# Patient Record
Sex: Female | Born: 1940 | Race: White | Hispanic: No | Smoking: Never smoker
Health system: Southern US, Community
[De-identification: ages and names within clinical notes are randomized; demographics above are authoritative.]

## PROBLEM LIST (undated history)

## (undated) DIAGNOSIS — K219 Gastro-esophageal reflux disease without esophagitis: Secondary | ICD-10-CM

## (undated) DIAGNOSIS — E785 Hyperlipidemia, unspecified: Secondary | ICD-10-CM

## (undated) DIAGNOSIS — G4730 Sleep apnea, unspecified: Secondary | ICD-10-CM

## (undated) DIAGNOSIS — Z820 Family history of epilepsy and other diseases of the nervous system: Secondary | ICD-10-CM

## (undated) DIAGNOSIS — R001 Bradycardia, unspecified: Secondary | ICD-10-CM

## (undated) DIAGNOSIS — I4891 Unspecified atrial fibrillation: Secondary | ICD-10-CM

## (undated) DIAGNOSIS — G459 Transient cerebral ischemic attack, unspecified: Secondary | ICD-10-CM

## (undated) DIAGNOSIS — M1990 Unspecified osteoarthritis, unspecified site: Secondary | ICD-10-CM

## (undated) DIAGNOSIS — I2510 Atherosclerotic heart disease of native coronary artery without angina pectoris: Secondary | ICD-10-CM

## (undated) DIAGNOSIS — K8020 Calculus of gallbladder without cholecystitis without obstruction: Secondary | ICD-10-CM

## (undated) DIAGNOSIS — M797 Fibromyalgia: Secondary | ICD-10-CM

## (undated) DIAGNOSIS — I10 Essential (primary) hypertension: Secondary | ICD-10-CM

## (undated) HISTORY — DX: Fibromyalgia: M79.7

## (undated) HISTORY — PX: HERNIA REPAIR: SHX51

## (undated) HISTORY — DX: Bradycardia, unspecified: R00.1

## (undated) HISTORY — DX: Calculus of gallbladder without cholecystitis without obstruction: K80.20

## (undated) HISTORY — DX: Gastro-esophageal reflux disease without esophagitis: K21.9

## (undated) HISTORY — DX: Essential (primary) hypertension: I10

## (undated) HISTORY — DX: Sleep apnea, unspecified: G47.30

## (undated) HISTORY — DX: Unspecified osteoarthritis, unspecified site: M19.90

## (undated) HISTORY — PX: ABDOMINAL SURGERY: SHX537

## (undated) HISTORY — DX: Atherosclerotic heart disease of native coronary artery without angina pectoris: I25.10

## (undated) HISTORY — DX: Unspecified atrial fibrillation: I48.91

## (undated) HISTORY — DX: Transient cerebral ischemic attack, unspecified: G45.9

## (undated) HISTORY — DX: Family history of epilepsy and other diseases of the nervous system: Z82.0

## (undated) HISTORY — PX: OTHER SURGICAL HISTORY: SHX169

## (undated) HISTORY — DX: Hyperlipidemia, unspecified: E78.5

---

## 1975-01-16 ENCOUNTER — Encounter: Payer: Self-pay | Admitting: Cardiology

## 2005-07-28 ENCOUNTER — Ambulatory Visit: Payer: Self-pay | Admitting: Cardiology

## 2005-08-09 ENCOUNTER — Ambulatory Visit: Payer: Self-pay

## 2005-08-09 ENCOUNTER — Encounter: Payer: Self-pay | Admitting: Cardiology

## 2006-04-25 ENCOUNTER — Ambulatory Visit: Payer: Self-pay | Admitting: Cardiology

## 2006-06-25 ENCOUNTER — Ambulatory Visit: Payer: Self-pay | Admitting: Cardiology

## 2006-07-02 ENCOUNTER — Ambulatory Visit: Payer: Self-pay | Admitting: Cardiology

## 2006-09-12 ENCOUNTER — Ambulatory Visit: Payer: Self-pay | Admitting: Cardiology

## 2006-09-26 ENCOUNTER — Ambulatory Visit: Payer: Self-pay

## 2007-07-24 ENCOUNTER — Ambulatory Visit: Payer: Self-pay | Admitting: Cardiology

## 2007-08-21 ENCOUNTER — Ambulatory Visit: Payer: Self-pay | Admitting: Cardiology

## 2007-10-09 ENCOUNTER — Ambulatory Visit: Payer: Self-pay | Admitting: Cardiology

## 2008-03-10 ENCOUNTER — Encounter: Admission: RE | Admit: 2008-03-10 | Discharge: 2008-03-10 | Payer: Self-pay | Admitting: General Surgery

## 2008-07-15 ENCOUNTER — Ambulatory Visit: Payer: Self-pay | Admitting: Cardiology

## 2008-07-15 DIAGNOSIS — I4891 Unspecified atrial fibrillation: Secondary | ICD-10-CM

## 2008-07-15 DIAGNOSIS — E7800 Pure hypercholesterolemia, unspecified: Secondary | ICD-10-CM

## 2008-07-15 DIAGNOSIS — I10 Essential (primary) hypertension: Secondary | ICD-10-CM | POA: Insufficient documentation

## 2008-07-15 DIAGNOSIS — K219 Gastro-esophageal reflux disease without esophagitis: Secondary | ICD-10-CM

## 2008-07-15 DIAGNOSIS — I2510 Atherosclerotic heart disease of native coronary artery without angina pectoris: Secondary | ICD-10-CM

## 2008-11-20 ENCOUNTER — Telehealth (INDEPENDENT_AMBULATORY_CARE_PROVIDER_SITE_OTHER): Payer: Self-pay | Admitting: *Deleted

## 2009-03-31 ENCOUNTER — Telehealth (INDEPENDENT_AMBULATORY_CARE_PROVIDER_SITE_OTHER): Payer: Self-pay | Admitting: *Deleted

## 2009-03-31 ENCOUNTER — Encounter: Payer: Self-pay | Admitting: Cardiology

## 2009-03-31 ENCOUNTER — Ambulatory Visit: Payer: Self-pay | Admitting: Cardiology

## 2009-04-06 ENCOUNTER — Encounter: Payer: Self-pay | Admitting: Cardiology

## 2009-04-09 ENCOUNTER — Encounter: Payer: Self-pay | Admitting: Cardiology

## 2009-05-25 ENCOUNTER — Encounter: Payer: Self-pay | Admitting: Cardiology

## 2009-05-27 ENCOUNTER — Encounter: Payer: Self-pay | Admitting: Cardiology

## 2009-05-28 ENCOUNTER — Encounter: Payer: Self-pay | Admitting: Cardiology

## 2009-05-31 ENCOUNTER — Encounter: Payer: Self-pay | Admitting: Cardiology

## 2009-06-02 ENCOUNTER — Ambulatory Visit: Payer: Self-pay | Admitting: Cardiology

## 2009-06-02 ENCOUNTER — Encounter: Payer: Self-pay | Admitting: Cardiology

## 2009-06-02 DIAGNOSIS — I498 Other specified cardiac arrhythmias: Secondary | ICD-10-CM

## 2009-07-21 ENCOUNTER — Encounter: Admission: RE | Admit: 2009-07-21 | Discharge: 2009-07-21 | Payer: Self-pay | Admitting: General Surgery

## 2009-07-28 ENCOUNTER — Encounter: Payer: Self-pay | Admitting: Cardiology

## 2009-07-28 ENCOUNTER — Ambulatory Visit: Payer: Self-pay | Admitting: Cardiology

## 2009-09-24 ENCOUNTER — Telehealth: Payer: Self-pay | Admitting: Cardiology

## 2009-10-14 ENCOUNTER — Encounter: Payer: Self-pay | Admitting: Cardiology

## 2009-11-01 ENCOUNTER — Telehealth: Payer: Self-pay | Admitting: Cardiology

## 2009-11-11 ENCOUNTER — Telehealth (INDEPENDENT_AMBULATORY_CARE_PROVIDER_SITE_OTHER): Payer: Self-pay | Admitting: *Deleted

## 2009-11-15 ENCOUNTER — Encounter: Payer: Self-pay | Admitting: Cardiology

## 2009-11-15 ENCOUNTER — Ambulatory Visit: Payer: Self-pay | Admitting: Cardiology

## 2009-11-15 ENCOUNTER — Encounter (HOSPITAL_COMMUNITY): Admission: RE | Admit: 2009-11-15 | Discharge: 2010-02-01 | Payer: Self-pay | Admitting: Cardiology

## 2009-11-15 ENCOUNTER — Ambulatory Visit: Payer: Self-pay

## 2010-02-23 ENCOUNTER — Encounter: Payer: Self-pay | Admitting: Cardiology

## 2010-02-23 ENCOUNTER — Ambulatory Visit: Payer: Self-pay | Admitting: Cardiology

## 2010-05-05 ENCOUNTER — Telehealth: Payer: Self-pay | Admitting: Cardiology

## 2010-06-28 NOTE — Letter (Signed)
Summary: [facility]  [facility]   Imported By: Roderic Ovens 11/26/2009 10:37:32  _____________________________________________________________________  External Attachment:    Type:   Image     Comment:   External Document

## 2010-06-28 NOTE — Progress Notes (Signed)
Summary: pt is thinking about knee replacement  Phone Note Call from Patient Call back at Home Phone [phone]   Caller: Daughter/Beth Reason for Call: Talk to Nurse, Talk to Doctor Summary of Call: pt is wanting to have knee replacement and daughter wants to talk about this with MD  Initial call taken by: Omer Jack,  September 24, 2009 11:46 AM  Follow-up for Phone Call        Talked with daughter, Waynetta Sandy. Mrs. Campise will be seeing Dr. Rana Snare at [facility] on Monday (09/27/09) to be evaluated for possible knee surgery.  I told her Dr. Jens Som was out of the office and I would forward the information to him.  She said that would be fine. Lisabeth Devoid RN     Appended Document: pt is thinking about knee replacement patient will need lovenox bridge at time of surgery  Appended Document: pt is thinking about knee replacement spoke with beth, they are aware of the need for lovenox. they are sending me the form for dr Jens Som to fill out prior to surgery

## 2010-06-28 NOTE — Progress Notes (Signed)
Summary: appt prior to surgery / forms  Phone Note Call from Patient Call back at Home Phone [phone]   Caller: Daughter Reason for Call: Acute Illness, Talk to Nurse Summary of Call: per pt daughter called. does pt need to see dr. Jens Som prior to surgery, pt daugher is aware that bc is out of office until 6/14 ,. debra his nurse is on vac as well. disuss form.  Initial call taken by: Lorne Skeens,  November 01, 2009 9:20 AM  Follow-up for Phone Call        [facility] Lisabeth Devoid RN I spoke with daughter, Waynetta Sandy,  who needs the surgical clearance form signed and returned to orthopaedic surgeon.  Mrs. Kinnison surgery is scheduled for 12/02/09. Instead of knee surgery she will be having hip surgery,  Her daughter also wanted to know if her mother needed to have the stress test before this surgery? I told her Dr. Jens Som was out of the office this week and I would forward it to him and his nurse as well.  She said the surgeon would like to have the form asap. Lisabeth Devoid RN     Appended Document: appt prior to surgery / forms repeat myoview prior to surgery; given history of possible TIA and perm. atrial fib, she will need lovenox bridge with surgery (ie begin lovenox after dcing coumadin when inr <2 and begin lovenox postop when ok with surgery until inr > 2.  Appended Document: Orders Update lexiscan myoview ordered for surgical clearence, she is aware of needing a lovenox bridge for her coumadin Deliah Goody, RN  November 09, 2009 2:56 PM \   Clinical Lists Changes  Orders: Added new Referral order of Nuclear Stress Test (Nuc Stress Test) - Signed

## 2010-06-28 NOTE — Assessment & Plan Note (Signed)
Summary: [facility]   Visit Type:  Follow-up  CC:  Pt. had TIA.  History of Present Illness: [name] is a very pleasant  female with coronary artery disease, permanent atrial fibrillation, hypertension. Last Myoview was performed in April 2008. There was breast attenuation but no scar or ischemia and ejection fraction was 76%. Last echocardiogram in March 2007 showed normal LV function and a small posterior pericardial effusion. I last saw her in November of this year. Since then she was admitted to a hospital in [place] with a possible TIA. Apparently she had transient left facial droop and dysarthria. She was felt to potentially have a TIA. While she was in the hospital she lost her balance while sitting on a commode and fell and hit her head. There was a significant hematoma over her left eye with ecchymosis. While she was hospitalized she was felt to be somewhat bradycardic with heart rates in the 50s and pauses of 2 seconds per the notes. I do not have strips available. She was seen by a cardiologist and her digoxin was discontinued and her atenolol dose decreased. Since then she does have dyspnea on exertion which is chronic. There is no orthopnea or PND and pedal edema is unchanged. She has not had chest pain or syncope. She is having problems with balance.  Current Medications (verified): 1)  Ranitidine Hcl 150 Mg Caps (Ranitidine Hcl) .... Take 1 Capsule By Mouth Two Times A Day 2)  Tramadol Hcl 50 Mg Tabs (Tramadol Hcl) .... 3-4- X A Day As Needed 3)  Aspirin 81 Mg Tbec (Aspirin) .... Take One Tablet By Mouth Daily 4)  Warfarin Sodium 5 Mg Tabs (Warfarin Sodium) .... Use As Directed By Anticoagulation Clinic 7 1/2 Mg Once Daily 5)  Multivitamins  Tabs (Multiple Vitamin) 6)  Nitrolingual 0.4 Mg/spray Soln (Nitroglycerin) .... One Spray Under Tongue Every 5 Minutes As Needed For Chest Pain---May Repeat Times Three 7)  Vesicare 10 Mg Tabs (Solifenacin Succinate) .... Take 1  Tablet By Mouth Once A Day 8)  Lisinopril 40 Mg Tabs (Lisinopril) .... Take One Tablet By Mouth Daily 9)  Atenolol 25 Mg Tabs (Atenolol) .... Take One Tablet By Mouth Daily 10)  Amlodipine Besylate 10 Mg Tabs (Amlodipine Besylate) .... Take One Tablet By Mouth Daily 11)  Lipitor 80 Mg Tabs (Atorvastatin Calcium) .... Take One Tablet By Mouth Daily. 12)  Gabapentin 300 Mg Caps (Gabapentin) .... Take 1 Capsule By Mouth Three Times A Day  Allergies: 1)  ! Sulfa  Past History:  Past Medical History: Atrial Fibrillation CAD (prior PCI of LAD and Lcx; [place]; Jan, 2004) Hyperlipidemia Hypertension G E R D and esophageal ulcerations Osteoarthritis Migraine Headaches Fibromyalgia TIA  Past Surgical History: Reviewed history from 07/15/2008 and no changes required. Abdominal surgery for small bowel obstruction Prior D and C Hysterectomy Hernia repair  Social History: Reviewed history from 07/15/2008 and no changes required. Tobacco Use - No.  Alcohol Use - no  Review of Systems       Problems with headache since fall but no fevers or chills, productive cough, hemoptysis, dysphasia, odynophagia, melena, hematochezia, dysuria, hematuria, rash, seizure activity, orthopnea, PND,  claudication. Remaining systems are negative.   Vital Signs:  Patient profile:   70 year old female Height:      64 inches Weight:      191 pounds BMI:     32.90 Pulse rate:   62 / minute Pulse rhythm:   irregular Resp:  18 per minute BP sitting:   100 / 60  (right arm) Cuff size:   large  Vitals Entered By: Vikki Ports (June 02, 2009 4:37 PM)  Physical Exam  General:  Well-developed well-nourished in no acute distress.  Skin is warm and dry.  HEENT is significant for severe ecchymosis over her left face and neck. There is a hematoma above the left orbit. Neck is supple. No thyromegaly.  Chest is clear to auscultation with normal expansion.  Cardiovascular exam is irregular.    Abdominal exam nontender or distended. No masses palpated. Extremities show 1+ edema. chronic skin changes neuro grossly intact    EKG  Procedure date:  06/02/2009  Findings:      Atrial fibrillation at a rate of 62. Axis normal. Nonspecific ST changes.  Impression & Recommendations:  Problem # 1:  ATRIAL FIBRILLATION (ICD-427.31) The patient remains in atrial fibrillation. She will continue on her beta blocker. She apparently was found to have some degree of bradycardia at the outside hospital. I will schedule her to have a CardioNet monitor. This will ensure that her heart rate is controlled over 24 hours or we can uptitrate her medications. If she is found to have tachybradycardia syndrome then she will require a pacemaker. She will continue on Coumadin with a goal INR of 2-3. This is being followed by Dr. Drue Second. Her daughter states that she is somewhat tenuous on her feet. She has falls in the future then we may need to discontinue the Coumadin. However she apparently had a recent TIA and I therefore would be extremely hesitant to discontinue it unless absolutely necessary. The following medications were removed from the medication list:    Digoxin 0.125 Mg Tabs (Digoxin) .Marland Kitchen... Take a half  tablet by mouth daily    Atenolol 50 Mg Tabs (Atenolol) .Marland Kitchen... Take one tablet by mouth am and 1 and 1/2 tablet pm Her updated medication list for this problem includes:    Aspirin 81 Mg Tbec (Aspirin) .Marland Kitchen... Take one tablet by mouth daily    Warfarin Sodium 5 Mg Tabs (Warfarin sodium) ..... Use as directed by anticoagulation clinic 7 1/2 mg once daily    Atenolol 25 Mg Tabs (Atenolol) .Marland Kitchen... Take one tablet by mouth daily  Orders: Event (Event)  Problem # 2:  BRADYCARDIA (ICD-427.89) We will schedule a CardioNet monitor as described in #1. The following medications were removed from the medication list:    Lisinopril 20 Mg Tabs (Lisinopril) .Marland Kitchen... Take 2 tablets am and 1 tablet pm    Atenolol  50 Mg Tabs (Atenolol) .Marland Kitchen... Take one tablet by mouth am and 1 and 1/2 tablet pm    Amlodipine Besylate 5 Mg Tabs (Amlodipine besylate) .Marland Kitchen... Take one tablet by mouth daily Her updated medication list for this problem includes:    Aspirin 81 Mg Tbec (Aspirin) .Marland Kitchen... Take one tablet by mouth daily    Warfarin Sodium 5 Mg Tabs (Warfarin sodium) ..... Use as directed by anticoagulation clinic 7 1/2 mg once daily    Nitrolingual 0.4 Mg/spray Soln (Nitroglycerin) ..... One spray under tongue every 5 minutes as needed for chest pain---may repeat times three    Lisinopril 40 Mg Tabs (Lisinopril) .Marland Kitchen... Take one tablet by mouth daily    Atenolol 25 Mg Tabs (Atenolol) .Marland Kitchen... Take one tablet by mouth daily    Amlodipine Besylate 10 Mg Tabs (Amlodipine besylate) .Marland Kitchen... Take one tablet by mouth daily  Orders: Event (Event)  Problem # 3:  COUMADIN THERAPY (  ICD-V58.61) Management per primary care.  Problem # 4:  HYPERCHOLESTEROLEMIA, PURE (ICD-272.0) Continue statin. Lipids and liver monitored by primary care. The following medications were removed from the medication list:    Lipitor 40 Mg Tabs (Atorvastatin calcium) .Marland Kitchen... Take one tablet by mouth daily. Her updated medication list for this problem includes:    Lipitor 80 Mg Tabs (Atorvastatin calcium) .Marland Kitchen... Take one tablet by mouth daily.  Problem # 5:  CAD, NATIVE VESSEL (ICD-414.01) Continue aspirin, beta blocker and statin. She will need a Myoview in approximately 7 months. The following medications were removed from the medication list:    Lisinopril 20 Mg Tabs (Lisinopril) .Marland Kitchen... Take 2 tablets am and 1 tablet pm    Atenolol 50 Mg Tabs (Atenolol) .Marland Kitchen... Take one tablet by mouth am and 1 and 1/2 tablet pm    Amlodipine Besylate 5 Mg Tabs (Amlodipine besylate) .Marland Kitchen... Take one tablet by mouth daily Her updated medication list for this problem includes:    Aspirin 81 Mg Tbec (Aspirin) .Marland Kitchen... Take one tablet by mouth daily    Warfarin Sodium 5 Mg Tabs  (Warfarin sodium) ..... Use as directed by anticoagulation clinic 7 1/2 mg once daily    Nitrolingual 0.4 Mg/spray Soln (Nitroglycerin) ..... One spray under tongue every 5 minutes as needed for chest pain---may repeat times three    Lisinopril 40 Mg Tabs (Lisinopril) .Marland Kitchen... Take one tablet by mouth daily    Atenolol 25 Mg Tabs (Atenolol) .Marland Kitchen... Take one tablet by mouth daily    Amlodipine Besylate 10 Mg Tabs (Amlodipine besylate) .Marland Kitchen... Take one tablet by mouth daily  Problem # 6:  GERD (ICD-530.81)  Her updated medication list for this problem includes:    Ranitidine Hcl 150 Mg Caps (Ranitidine hcl) .Marland Kitchen... Take 1 capsule by mouth two times a day  Patient Instructions: 1)  Your physician recommends that you schedule a follow-up appointment in: 8 weeks 2)  Your physician has recommended that you wear an event monitor.  Event monitors are medical devices that record the heart's electrical activity. Doctors most often use these monitors to diagnose arrhythmias. Arrhythmias are problems with the speed or rhythm of the heartbeat. The monitor is a small, portable device. You can wear one while you do your normal daily activities. This is usually used to diagnose what is causing palpitations/syncope (passing out).

## 2010-06-28 NOTE — Assessment & Plan Note (Signed)
Summary: Cardiology Nuclear Study  Nuclear Med Background Indications for Stress Test: Evaluation for Ischemia, Surgical Clearance, Stent Patency  Indications Comments: Pending (R) THR on 7/7/11by Dr. Ander Slade  History: Echo, Heart Catheterization, Myocardial Infarction, Myocardial Perfusion Study, Stents  History Comments: '04 MI>Stents x 3; '07 Echo:EF=55%; '08 [id number] fixed defect, EF=76%; h/o afib.  Symptoms: DOE    Nuclear Pre-Procedure Cardiac Risk Factors: Family History - CAD, Hypertension, Lipids, Obesity, TIA Caffeine/Decaff Intake: None NPO After: 6:00 PM Lungs: Clear.  O2 Sat 96% on RA IV 0.9% NS with Angio Cath: 20g     IV Site: (R) AC IV Started by: Stanton Kidney EMT-P Chest Size (in) 44     Cup Size F     Height (in): 63 Weight (lb): 189 BMI: 33.60 Tech Comments: Atenolol held this am, per patient.  Nuclear Med Study 1 or 2 day study:  1 day     Stress Test Type:  Eugenie Birks Reading MD:  Willa Rough, MD     Referring MD:  Olga Millers, MD Resting Radionuclide:  Technetium 46m Tetrofosmin     Resting Radionuclide Dose:  10.9 mCi  Stress Radionuclide:  Technetium 28m Tetrofosmin     Stress Radionuclide Dose:  33 mCi   Stress Protocol   Lexiscan: 0.4 mg   Stress Test Technologist:  Rea College CMA-N     Nuclear Technologist:  Domenic Polite CNMT  Rest Procedure  Myocardial perfusion imaging was performed at rest 45 minutes following the intravenous administration of Myoview Technetium 87m Tetrofosmin.  Stress Procedure  The patient received IV Lexiscan 0.4 mg over 15-seconds.  Myoview injected at 30-seconds.  There were no significant changes with infusion; rare PVC.  Quantitative spect images were obtained after a 45 minute delay.  QPS Raw Data Images:  Normal; no motion artifact; normal heart/lung ratio. Stress Images:  Decreased activity in antero-apical wall c/w breast attenuation. Rest Images:  Same as stress. Subtraction (SDS):  No  evidence of ischemia. Transient Ischemic Dilatation:  .95  (Normal <1.22)  Lung/Heart Ratio:  .32  (Normal <0.45)  Quantitative Gated Spect Images QGS EDV:  84 ml QGS ESV:  29 ml QGS EF:  66 % QGS cine images:  Normal motion  Findings Low risk nuclear study      Overall Impression  Exercise Capacity: Lexiscan BP Response: Normal blood pressure response. Clinical Symptoms: heart racing ECG Impression: No significant ST segment change suggestive of ischemia. Overall Impression Comments: There is no change from the study of 08/2006. There is anterior breast attenuation as noted before. There is no definite scar or ischemia.  Appended Document: Cardiology Nuclear Study ok for surgery  Appended Document: Cardiology Nuclear Study Left message to call back    Appended Document: Cardiology Nuclear Study pt aware of results, note will be faxed to Dr Melodye Ped in [place]. fax # [phone].

## 2010-06-28 NOTE — Progress Notes (Signed)
Summary: Nuclear Pre-Procedure  Phone Note Outgoing Call Call back at Home Phone [phone]   Call placed by: Stanton Kidney, EMT-P,  November 11, 2009 10:50 AM Action Taken: Phone Call Completed Summary of Call: Left message with information on Myoview Information Sheet (see scanned document for details).     Nuclear Med Background Indications for Stress Test: Evaluation for Ischemia, Surgical Clearance, Stent Patency  Indications Comments: Pending Hip Surgery 12/02/09  History: Echo, Heart Catheterization, Myocardial Infarction, Myocardial Perfusion Study, Stents  History Comments: '04 MI > Stents x 3 [facility], [place]) '07 Echo: EF=55% '08 MPS: EF=76%, Ant. Apical fixed defect     Nuclear Pre-Procedure Cardiac Risk Factors: Family History - CAD, Hypertension, Lipids, TIA Height (in): 64

## 2010-06-28 NOTE — Letter (Signed)
Summary: [facility]  [facility]   Imported By: Marylou Mccoy 06/16/2009 17:05:52  _____________________________________________________________________  External Attachment:    Type:   Image     Comment:   External Document

## 2010-06-28 NOTE — Assessment & Plan Note (Signed)
Summary: [facility]   Visit Type:  Follow-up  CC:  No complains.  History of Present Illness: Sharon Garrison is a very pleasant  female with coronary artery disease, permanent atrial fibrillation, hypertension. Last Myoview was performed in June of 2011. There was breast attenuation but no scar or ischemia and ejection fraction was 66%. Last echocardiogram in March 2007 showed normal LV function and a small posterior pericardial effusion. Note previous admission at outside hospital for TIA; also noted to be bradycardic and digoxin DCed and atenolol decreased. F/U holter showed her rate was controlled.  I last saw her in March of 2011. Since then, she has had her right hip replaced and is improving. The patient has dyspnea with more extreme activities but not with routine activities. It is relieved with rest. It is not associated with chest pain. There is no orthopnea, PND; she has chronic pedal edema. There is no syncope or palpitations. There is no exertional chest pain.   Current Medications (verified): 1)  Aspirin 81 Mg Tbec (Aspirin) .... Take One Tablet By Mouth Daily 2)  Warfarin Sodium 5 Mg Tabs (Warfarin Sodium) .... Use As Directed By Anticoagulation Clinic 7 1/2 Mg Once Daily 3)  Nitrolingual 0.4 Mg/spray Soln (Nitroglycerin) .... One Spray Under Tongue Every 5 Minutes As Needed For Chest Pain---May Repeat Times Three 4)  Lisinopril 30 Mg Tabs (Lisinopril) .... Take One Tablet By Mouth Daily 5)  Atenolol 25 Mg Tabs (Atenolol) .... Take One Tablet By Mouth Daily 6)  Amlodipine Besylate 5 Mg Tabs (Amlodipine Besylate) .... Take 1 1/2 Tablets Daily 7)  Gabapentin 600 Mg Tabs (Gabapentin) .... Take 1 Tablet By Mouth Once A Day 8)  Nitrostat 0.4 Mg Subl (Nitroglycerin) .Marland Kitchen.. 1 Tablet Under Tongue At Onset of Chest Pain; You May Repeat Every 5 Minutes For Up To 3 Doses. 9)  Oxybutynin Chloride 10 Mg Xr24h-Tab (Oxybutynin Chloride) .... Take 1 Tablet By Mouth Once A Day 10)  Vitamin B 12  Injection .... Once A Month 11)  Miralax  Powd (Polyethylene Glycol 3350) .Marland Kitchen.. 17gr Every Other Day 12)  Magox 400 400 (241.3 Mg) Mg Tabs (Magnesium Oxide) .... Take 1 Tablet By Mouth Once A Day 13)  Anusol-Hc 25 Mg Supp (Hydrocortisone Acetate) .... As Needed 14)  Aricept 10 Mg Tabs (Donepezil Hcl) .... Take 1 Tablet By Mouth Once A Day 15)  Chromagen/mutigen .... Daily 16)  Crestor 5 Mg Tabs (Rosuvastatin Calcium) .... Take One Tablet By Mouth Daily. 17)  Pepcid 20 Mg Tabs (Famotidine) .... Take 1 Tablet By Mouth Two Times A Day 18)  Roxicodone 5 Mg Tabs (Oxycodone Hcl) .... 1/2 Tablet Daily 19)  Theregran .... Once Daily  Allergies: 1)  ! Sulfa  Past History:  Past Medical History: Reviewed history from 06/02/2009 and no changes required. Atrial Fibrillation CAD (prior PCI of LAD and Lcx; [place]; Jan, 2004) Hyperlipidemia Hypertension G E R D and esophageal ulcerations Osteoarthritis Migraine Headaches Fibromyalgia TIA  Past Surgical History: Reviewed history from 07/15/2008 and no changes required. Abdominal surgery for small bowel obstruction Prior D and C Hysterectomy Hernia repair  Social History: Reviewed history from 07/15/2008 and no changes required. Tobacco Use - No.  Alcohol Use - no  Review of Systems       Arthralgias but no fevers or chills, productive cough, hemoptysis, dysphasia, odynophagia, melena, hematochezia, dysuria, hematuria, rash, seizure activity, orthopnea, PND,  claudication. Remaining systems are negative.   Vital Signs:  Patient profile:   70 year  old female Height:      63 inches Pulse rate:   74 / minute Pulse rhythm:   irregular Resp:     18 per minute BP sitting:   115 / 74  (left arm) Cuff size:   large  Vitals Entered By: Deliah Goody, RN (February 23, 2010 2:18 PM)  Physical Exam  General:  Well-developed well-nourished in no acute distress.  Skin is warm and dry.  HEENT is normal.  Neck is supple. No thyromegaly.   Chest is clear to auscultation with normal expansion.  Cardiovascular exam is irregular Abdominal exam nontender or distended. No masses palpated. Extremities show no edema. neuro grossly intact    Impression & Recommendations:  Problem # 1:  COUMADIN THERAPY (ICD-V58.61) Monitored by primary care. Goal INR 2-3.  Problem # 2:  HYPERCHOLESTEROLEMIA, PURE (ICD-272.0) Continue statin. Lipids and liver monitored by primary care. The following medications were removed from the medication list:    Lipitor 40 Mg Tabs (Atorvastatin calcium) .Marland Kitchen... Take one tablet by mouth daily. Her updated medication list for this problem includes:    Crestor 5 Mg Tabs (Rosuvastatin calcium) .Marland Kitchen... Take one tablet by mouth daily.  Problem # 3:  CAD, NATIVE VESSEL (ICD-414.01) Continue aspirin, beta blocker, ACE inhibitor and statin. Her updated medication list for this problem includes:    Aspirin 81 Mg Tbec (Aspirin) .Marland Kitchen... Take one tablet by mouth daily    Warfarin Sodium 5 Mg Tabs (Warfarin sodium) ..... Use as directed by anticoagulation clinic 7 1/2 mg once daily    Nitrolingual 0.4 Mg/spray Soln (Nitroglycerin) ..... One spray under tongue every 5 minutes as needed for chest pain---may repeat times three    Lisinopril 30 Mg Tabs (Lisinopril) .Marland Kitchen... Take one tablet by mouth daily    Atenolol 25 Mg Tabs (Atenolol) .Marland Kitchen... Take one tablet by mouth daily    Amlodipine Besylate 5 Mg Tabs (Amlodipine besylate) .Marland Kitchen... Take 1 1/2 tablets daily    Nitrostat 0.4 Mg Subl (Nitroglycerin) .Marland Kitchen... 1 tablet under tongue at onset of chest pain; you may repeat every 5 minutes for up to 3 doses.  Problem # 4:  ATRIAL FIBRILLATION (ICD-427.31) Continued beta blocker and Coumadin. Patient would like to consider pradaxa in the future if covered by her insurance company. Her updated medication list for this problem includes:    Aspirin 81 Mg Tbec (Aspirin) .Marland Kitchen... Take one tablet by mouth daily    Warfarin Sodium 5 Mg Tabs  (Warfarin sodium) ..... Use as directed by anticoagulation clinic 7 1/2 mg once daily    Atenolol 25 Mg Tabs (Atenolol) .Marland Kitchen... Take one tablet by mouth daily  Problem # 5:  HYPERTENSION, BENIGN (ICD-401.1) Blood pressure controlled on present medications. Will continue. Potassium and renal function monitored by primary care. Her updated medication list for this problem includes:    Aspirin 81 Mg Tbec (Aspirin) .Marland Kitchen... Take one tablet by mouth daily    Lisinopril 30 Mg Tabs (Lisinopril) .Marland Kitchen... Take one tablet by mouth daily    Atenolol 25 Mg Tabs (Atenolol) .Marland Kitchen... Take one tablet by mouth daily    Amlodipine Besylate 5 Mg Tabs (Amlodipine besylate) .Marland Kitchen... Take 1 1/2 tablets daily  Patient Instructions: 1)  Your physician recommends that you schedule a follow-up appointment in: 6 MONTHS

## 2010-06-28 NOTE — Assessment & Plan Note (Signed)
Summary: [facility]   Visit Type:  2 months follow up  CC:  Pt. feels better now.  History of Present Illness: Sharon Garrison is a very pleasant  female with coronary artery disease, permanent atrial fibrillation, hypertension. Last Myoview was performed in April 2008. There was breast attenuation but no scar or ischemia and ejection fraction was 76%. Last echocardiogram in March 2007 showed normal LV function and a small posterior pericardial effusion. I last saw her in January of 2011. She had been admitted to a hospital in [place] prior to that with a possible TIA. Apparently she had transient left facial droop and dysarthria. She was felt to potentially have a TIA. While she was in the hospital she lost her balance while sitting on a commode and fell and hit her head. There was a significant hematoma over her left eye with ecchymosis. While she was hospitalized she was felt to be somewhat bradycardic with heart rates in the 50s and pauses of 2 seconds per the notes. Those strips were not  available. She was seen by a cardiologist and her digoxin was discontinued and her atenolol dose decreased. When I saw her previously we scheduled a monitor to follow her heart rate. This showed that her heart rate was controlled. Since I last saw her she does have some dyspnea on exertion relieved with rest. There is no associated chest pain. There is no orthopnea or PND but has chronic pedal edema. She has not had chest pain, palpitations or syncope. There is no bleeding.  Current Medications (verified): 1)  Ranitidine Hcl 150 Mg Caps (Ranitidine Hcl) .... Take 1 Capsule By Mouth Two Times A Day 2)  Aspirin 81 Mg Tbec (Aspirin) .... Take One Tablet By Mouth Daily 3)  Warfarin Sodium 5 Mg Tabs (Warfarin Sodium) .... Use As Directed By Anticoagulation Clinic 7 1/2 Mg Once Daily 4)  Nitrolingual 0.4 Mg/spray Soln (Nitroglycerin) .... One Spray Under Tongue Every 5 Minutes As Needed For Chest Pain---May  Repeat Times Three 5)  Lisinopril 40 Mg Tabs (Lisinopril) .... Take One Tablet By Mouth Daily 6)  Atenolol 25 Mg Tabs (Atenolol) .... Take One Tablet By Mouth Daily 7)  Amlodipine Besylate 10 Mg Tabs (Amlodipine Besylate) .... Take One Tablet By Mouth Daily 8)  Lipitor 40 Mg Tabs (Atorvastatin Calcium) .... Take One Tablet By Mouth Daily. 9)  Gabapentin 300 Mg Caps (Gabapentin) .... Take 1 Capsule By Mouth Three Times A Day 10)  Nitrostat 0.4 Mg Subl (Nitroglycerin) .Marland Kitchen.. 1 Tablet Under Tongue At Onset of Chest Pain; You May Repeat Every 5 Minutes For Up To 3 Doses. 11)  Oxybutynin Chloride 10 Mg Xr24h-Tab (Oxybutynin Chloride) .... Take 1 Tablet By Mouth Once A Day 12)  Certagen  Tabs (Multiple Vitamins-Minerals) .... Take 1 Tablet By Mouth Once A Day 13)  Senna 8.6 Mg Tabs (Sennosides) .... Take 1 Tablet By Mouth Two Times A Day At Bedtime 14)  Vitamin B 12 Injection .... Once A Month 15)  Ultram 50 Mg Tabs (Tramadol Hcl) .... As Needed  Allergies: 1)  ! Sulfa  Past History:  Past Medical History: Reviewed history from 06/02/2009 and no changes required. Atrial Fibrillation CAD (prior PCI of LAD and Lcx; [place]; Jan, 2004) Hyperlipidemia Hypertension G E R D and esophageal ulcerations Osteoarthritis Migraine Headaches Fibromyalgia TIA  Past Surgical History: Reviewed history from 07/15/2008 and no changes required. Abdominal surgery for small bowel obstruction Prior D and C Hysterectomy Hernia repair  Social History: Reviewed history  from 07/15/2008 and no changes required. Tobacco Use - No.  Alcohol Use - no  Review of Systems       Residual weakness from fall but improving. No fevers or chills, productive cough, hemoptysis, dysphasia, odynophagia, melena, hematochezia, dysuria, hematuria, rash, seizure activity, orthopnea, PND,  claudication. Remaining systems are negative.   Vital Signs:  Patient profile:   70 year old female Height:      64 inches Weight:       197 pounds BMI:     33.94 Pulse rate:   51 / minute Pulse rhythm:   irregular Resp:     18 per minute BP sitting:   111 / 62  (right arm) Cuff size:   small  Vitals Entered By: Vikki Ports (July 28, 2009 3:09 PM)  Physical Exam  General:  Well-developed well-nourished in no acute distress.  Skin is warm and dry.  HEENT is normal.  Neck is supple. No thyromegaly.  Chest is clear to auscultation with normal expansion.  Cardiovascular exam is irregular.  Abdominal exam nontender or distended. No masses palpated. Extremities show 1+ edema. neuro grossly intact    Impression & Recommendations:  Problem # 1:  COUMADIN THERAPY (ICD-V58.61) Goal INR 2-3.  Problem # 2:  BRADYCARDIA (ICD-427.89) I have reviewed her monitor. Her heart rate is controlled. She had no pauses greater than 3 seconds in her heart rate in general was in the 50s and 60s. Continue present medications. Her updated medication list for this problem includes:    Aspirin 81 Mg Tbec (Aspirin) .Marland Kitchen... Take one tablet by mouth daily    Warfarin Sodium 5 Mg Tabs (Warfarin sodium) ..... Use as directed by anticoagulation clinic 7 1/2 mg once daily    Nitrolingual 0.4 Mg/spray Soln (Nitroglycerin) ..... One spray under tongue every 5 minutes as needed for chest pain---may repeat times three    Lisinopril 40 Mg Tabs (Lisinopril) .Marland Kitchen... Take one tablet by mouth daily    Atenolol 25 Mg Tabs (Atenolol) .Marland Kitchen... Take one tablet by mouth daily    Amlodipine Besylate 10 Mg Tabs (Amlodipine besylate) .Marland Kitchen... Take one tablet by mouth daily    Nitrostat 0.4 Mg Subl (Nitroglycerin) .Marland Kitchen... 1 tablet under tongue at onset of chest pain; you may repeat every 5 minutes for up to 3 doses.  Problem # 3:  HYPERCHOLESTEROLEMIA, PURE (ICD-272.0) Continue statin. Lipids and liver monitored by primary care. Her updated medication list for this problem includes:    Lipitor 40 Mg Tabs (Atorvastatin calcium) .Marland Kitchen... Take one tablet by mouth  daily.  Problem # 4:  CAD, NATIVE VESSEL (ICD-414.01) Continue aspirin, beta blocker, ACE inhibitor and statin. Plan repeat Myoview when she returns in 4 months. Her updated medication list for this problem includes:    Aspirin 81 Mg Tbec (Aspirin) .Marland Kitchen... Take one tablet by mouth daily    Warfarin Sodium 5 Mg Tabs (Warfarin sodium) ..... Use as directed by anticoagulation clinic 7 1/2 mg once daily    Nitrolingual 0.4 Mg/spray Soln (Nitroglycerin) ..... One spray under tongue every 5 minutes as needed for chest pain---may repeat times three    Lisinopril 40 Mg Tabs (Lisinopril) .Marland Kitchen... Take one tablet by mouth daily    Atenolol 25 Mg Tabs (Atenolol) .Marland Kitchen... Take one tablet by mouth daily    Amlodipine Besylate 10 Mg Tabs (Amlodipine besylate) .Marland Kitchen... Take one tablet by mouth daily    Nitrostat 0.4 Mg Subl (Nitroglycerin) .Marland Kitchen... 1 tablet under tongue at onset of chest pain;  you may repeat every 5 minutes for up to 3 doses.  Problem # 5:  ATRIAL FIBRILLATION (ICD-427.31) Continue Coumadin and beta blocker. Her updated medication list for this problem includes:    Aspirin 81 Mg Tbec (Aspirin) .Marland Kitchen... Take one tablet by mouth daily    Warfarin Sodium 5 Mg Tabs (Warfarin sodium) ..... Use as directed by anticoagulation clinic 7 1/2 mg once daily    Atenolol 25 Mg Tabs (Atenolol) .Marland Kitchen... Take one tablet by mouth daily  Problem # 6:  HYPERTENSION, BENIGN (ICD-401.1) Blood pressure controlled on present medications. Will continue. Her updated medication list for this problem includes:    Aspirin 81 Mg Tbec (Aspirin) .Marland Kitchen... Take one tablet by mouth daily    Lisinopril 40 Mg Tabs (Lisinopril) .Marland Kitchen... Take one tablet by mouth daily    Atenolol 25 Mg Tabs (Atenolol) .Marland Kitchen... Take one tablet by mouth daily    Amlodipine Besylate 10 Mg Tabs (Amlodipine besylate) .Marland Kitchen... Take one tablet by mouth daily  Patient Instructions: 1)  Your physician recommends that you schedule a follow-up appointment in: 4 MONTHS

## 2010-06-28 NOTE — Consult Note (Signed)
Summary: [facility]  [facility]   Imported By: Marylou Mccoy 06/16/2009 17:19:01  _____________________________________________________________________  External Attachment:    Type:   Image     Comment:   External Document

## 2010-06-28 NOTE — Progress Notes (Signed)
       Additional Follow-up for Phone Call Additional follow up Details #2::    called pt serval times left message for her to call me back seems as if lipitor was removed off her list last visit and her pharmacy is requesting refills need to verify what pt is on.Marland KitchenMarland KitchenLilli Dewald states she was in a nursing home for several months and they switched her over to crestor she says she asked them if she can she be switched back to lipitor they said yes is this ok to fill i have no record of the nursing home regaridng the change will foward his to [facility] for the ok to fill this medication...called pharmacy gave pt ATORVASTATIN 40mg  12 refills Follow-up by: Kem Parkinson,  May 05, 2010 9:58 AM  Additional Follow-up for Phone Call Additional follow up Details #3:: Details for Additional Follow-up Action Taken: will be fine to prescribe lipitor 40mg  once daily Deliah Goody, RN  May 05, 2010 11:11 AM   New/Updated Medications: LIPITOR 40 MG TABS (ATORVASTATIN CALCIUM) Take one tablet by mouth daily.

## 2010-07-20 ENCOUNTER — Ambulatory Visit (INDEPENDENT_AMBULATORY_CARE_PROVIDER_SITE_OTHER): Payer: Medicare Other | Admitting: Cardiology

## 2010-07-20 ENCOUNTER — Encounter: Payer: Self-pay | Admitting: Cardiology

## 2010-07-20 DIAGNOSIS — I4891 Unspecified atrial fibrillation: Secondary | ICD-10-CM

## 2010-07-20 DIAGNOSIS — I2510 Atherosclerotic heart disease of native coronary artery without angina pectoris: Secondary | ICD-10-CM

## 2010-07-26 NOTE — Assessment & Plan Note (Signed)
Summary: [facility]   Visit Type:  Follow-up  CC:  Legs edema.  History of Present Illness: [name] is a very pleasant  female with coronary artery disease, permanent atrial fibrillation, hypertension. Last Myoview was performed in June of 2011. There was breast attenuation but no scar or ischemia and ejection fraction was 66%. Last echocardiogram in March 2007 showed normal LV function and a small posterior pericardial effusion. Note previous admission at outside hospital for TIA; also noted to be bradycardic and digoxin DCed and atenolol decreased. F/U holter showed her rate was controlled.  I last saw her in September of 2011. Since then, the patient has dyspnea with more extreme activities but not with routine activities. It is relieved with rest. It is not associated with chest pain. There is no orthopnea, PND. There is no syncope or palpitations. There is no exertional chest pain. She has chronic pedal edema.   Current Medications (verified): 1)  Aspirin 81 Mg Tbec (Aspirin) .... Take One Tablet By Mouth Daily 2)  Warfarin Sodium 5 Mg Tabs (Warfarin Sodium) .... Use As Directed By Anticoagulation Clinic 7 1/2 Mg Once Daily 3)  Lisinopril 40 Mg Tabs (Lisinopril) .... Take One Tablet By Mouth Daily 4)  Atenolol 25 Mg Tabs (Atenolol) .... Take One Tablet By Mouth Daily 5)  Amlodipine Besylate 10 Mg Tabs (Amlodipine Besylate) .... Take One Tablet By Mouth Daily 6)  Gabapentin 600 Mg Tabs (Gabapentin) .... Take 1 Tablet By Mouth Three Times A Day 7)  Nitrostat 0.4 Mg Subl (Nitroglycerin) .Marland Kitchen.. 1 Tablet Under Tongue At Onset of Chest Pain; You May Repeat Every 5 Minutes For Up To 3 Doses. 8)  Vitamin B 12 Injection .... Once A Month 9)  Miralax  Powd (Polyethylene Glycol 3350) .Marland Kitchen.. 17gr Twice A Week 10)  Magox 400 400 (241.3 Mg) Mg Tabs (Magnesium Oxide) .... Take 1 Tablet By Mouth Once A Day 11)  Anusol-Hc 25 Mg Supp (Hydrocortisone Acetate) .... As Needed 12)  Lipitor 40 Mg Tabs  (Atorvastatin Calcium) .... Take One Tablet By Mouth Daily. 13)  Ranitidine Hcl 150 Mg Tabs (Ranitidine Hcl) .... Take 1 Tablet By Mouth Two Times A Day 14)  Tramadol Hcl 50 Mg Tabs (Tramadol Hcl) .... As Needed 15)  Therems M  Tabs (Multiple Vitamins-Minerals) .... Take 1 Tablet By Mouth Once A Day 16)  Vesicare 10 Mg Tabs (Solifenacin Succinate) .... Take 1 Tablet By Mouth Once A Day  Allergies: 1)  ! Sulfa 2)  ! Penicillin  Past History:  Past Medical History: Reviewed history from 06/02/2009 and no changes required. Atrial Fibrillation CAD (prior PCI of LAD and Lcx; [place]; Jan, 2004) Hyperlipidemia Hypertension G E R D and esophageal ulcerations Osteoarthritis Migraine Headaches Fibromyalgia TIA  Past Surgical History: Reviewed history from 07/15/2008 and no changes required. Abdominal surgery for small bowel obstruction Prior D and C Hysterectomy Hernia repair  Social History: Reviewed history from 07/15/2008 and no changes required. Tobacco Use - No.  Alcohol Use - no  Review of Systems       Bilateral knee pain but no fevers or chills, productive cough, hemoptysis, dysphasia, odynophagia, melena, hematochezia, dysuria, hematuria, rash, seizure activity, orthopnea, PND,  claudication. Remaining systems are negative.   Vital Signs:  Patient profile:   70 year old female Height:      63 inches Weight:      213.25 pounds BMI:     37.91 Pulse rate:   66 / minute Pulse rhythm:  irregular Resp:     18 per minute BP sitting:   100 / 70  (left arm) Cuff size:   large  Vitals Entered By: Vikki Ports (July 20, 2010 2:49 PM)  Physical Exam  General:  Well-developed well-nourished in no acute distress.  Skin is warm and dry.  HEENT is normal.  Neck is supple. No thyromegaly.  Chest is clear to auscultation with normal expansion.  Cardiovascular exam is irregular Abdominal exam nontender or distended. No masses palpated. Extremities show 2+  edema. neuro grossly intact    EKG  Procedure date:  07/20/2010  Findings:      Atrial fibrillation at a rate of 66. Nonspecific ST changes.  Impression & Recommendations:  Problem # 1:  COUMADIN THERAPY (ICD-V58.61) Monitored by primary care. Goal INR 2-3.  Problem # 2:  HYPERCHOLESTEROLEMIA, PURE (ICD-272.0) Continue statin. Lipids and liver monitored by primary care. Her updated medication list for this problem includes:    Lipitor 40 Mg Tabs (Atorvastatin calcium) .Marland Kitchen... Take one tablet by mouth daily.  Problem # 3:  CAD, NATIVE VESSEL (ICD-414.01) Continue aspirin, beta blocker, ACE inhibitor and statin. The following medications were removed from the medication list:    Nitrolingual 0.4 Mg/spray Soln (Nitroglycerin) ..... One spray under tongue every 5 minutes as needed for chest pain---may repeat times three Her updated medication list for this problem includes:    Aspirin 81 Mg Tbec (Aspirin) .Marland Kitchen... Take one tablet by mouth daily    Warfarin Sodium 5 Mg Tabs (Warfarin sodium) ..... Use as directed by anticoagulation clinic 7 1/2 mg once daily    Lisinopril 40 Mg Tabs (Lisinopril) .Marland Kitchen... Take one tablet by mouth daily    Atenolol 25 Mg Tabs (Atenolol) .Marland Kitchen... Take one tablet by mouth daily    Amlodipine Besylate 10 Mg Tabs (Amlodipine besylate) .Marland Kitchen... Take one tablet by mouth daily    Nitrostat 0.4 Mg Subl (Nitroglycerin) .Marland Kitchen... 1 tablet under tongue at onset of chest pain; you may repeat every 5 minutes for up to 3 doses.  Problem # 4:  ATRIAL FIBRILLATION (ICD-427.31) Rate is controlled on present medications. Continue Coumadin. Her updated medication list for this problem includes:    Aspirin 81 Mg Tbec (Aspirin) .Marland Kitchen... Take one tablet by mouth daily    Warfarin Sodium 5 Mg Tabs (Warfarin sodium) ..... Use as directed by anticoagulation clinic 7 1/2 mg once daily    Atenolol 25 Mg Tabs (Atenolol) .Marland Kitchen... Take one tablet by mouth daily  Problem # 5:  HYPERTENSION, BENIGN  (ICD-401.1) Blood pressure controlled. Continue present medications. Potassium and renal function monitored by primary care. Her updated medication list for this problem includes:    Aspirin 81 Mg Tbec (Aspirin) .Marland Kitchen... Take one tablet by mouth daily    Lisinopril 40 Mg Tabs (Lisinopril) .Marland Kitchen... Take one tablet by mouth daily    Atenolol 25 Mg Tabs (Atenolol) .Marland Kitchen... Take one tablet by mouth daily    Amlodipine Besylate 10 Mg Tabs (Amlodipine besylate) .Marland Kitchen... Take one tablet by mouth daily  Patient Instructions: 1)  Your physician wants you to follow-up in: 9 MONTHS  You will receive a reminder letter in the mail two months in advance. If you don't receive a letter, please call our office to schedule the follow-up appointment.

## 2010-10-11 NOTE — Assessment & Plan Note (Signed)
[facility]                            CARDIOLOGY OFFICE NOTE   Sharon Garrison, Sharon Garrison                        MRN:          [id number]  DATE:07/15/2008                            DOB:          January 02, 1941    Sharon Garrison is a very pleasant 70 year old female with coronary artery  disease, permanent atrial fibrillation, hypertension.  Since I last saw  her, she has some dyspnea on exertion which has been a longstanding  issue and unchanged.  I last saw her on Oct 09, 2007.  She denies any  orthopnea or PND.  There is no increased pedal edema.  She has not had  chest pain, palpitations, or syncope.  She did have a spell where she  felt dizzy with standing suddenly.   MEDICATIONS:  1. Zantac 150 mg p.o. b.i.d.  2. Tramadol.  3. Aspirin 81 mg p.o. daily.  4. Lipitor 20 mg p.o. daily.  5. Coumadin as directed.  6. Digoxin 0.25 mg p.o. daily.  7. VESIcare.  8. Lisinopril 40 mg in the morning and 20 in the evening.  9. Edecrin.  10.Atenolol 50 mg in the morning and 75 in the evening.  11.Multivitamin.  12.Amlodipine 5 mg p.o. daily.  13.Lipitor 40 mg p.o. daily.   PHYSICAL EXAMINATION:  VITAL SIGNS:  Blood pressure 151/76 and pulse is  54.  She weighs 197 pounds.  HEENT:  Normal.  NECK:  Supple.  No bruits.  CHEST:  Clear.  CARDIOVASCULAR:  Irregular rhythm.  ABDOMEN:  No tenderness.  EXTREMITIES:  Trace edema.   Her electrocardiogram shows atrial fibrillation at a rate of 47.  The  axis is normal.  There are nonspecific ST changes.   DIAGNOSES:  1. Permanent atrial fibrillation - Sharon Garrison is doing well from      symptomatic standpoint.  However, her heart rate is somewhat low      today.  We will continue the digoxin and atenolol for now.  I will      check a Holter monitor to make sure that she is not becoming      bradycardic.  She will also continue on Coumadin with goal INR 2-3      and this is being monitored by Dr. Drue Second.  2. History  of coronary artery disease - she will continue on aspirin,      ACE inhibitor, statin, and beta-blocker.  She did have a Myoview in      April 2008 that showed breast attenuation, but no ischemia or      infarction.  Her ejection fraction is 76%.  3. Hypertension - her blood pressure is mildly elevated.  However, she      states it typically runs in the 130/80 range at home.  We will      continue the present medications.  4. Hyperlipidemia - she will continue her statin.  We will have her      most recent lipids and liver forward to [place] from Dr. Feliz Beam      office.  5. Gastroesophageal reflux disease.   We will see  her back in 9 months.     Madolyn Frieze Jens Som, MD, [facility]  Electronically Signed    BSC/MedQ  DD: 07/15/2008  DT: 07/16/2008  Job #: [id number]   cc:   Dalbert Mayotte, M.D.

## 2010-10-11 NOTE — Assessment & Plan Note (Signed)
[facility]                            CARDIOLOGY OFFICE NOTE   Sharon, Garrison                        MRN:          [id number]  DATE:07/24/2007                            DOB:          [birth year]    Sharon Garrison is a very pleasant 70 year old female has a history of  coronary disease, permanent atrial fibrillation and hypertension.  Since  I last saw her, we did perform a Myoview on September 26, 2006.  The  ejection fraction was noted to be 76%.  There was felt to the breast  attenuation but no significant ischemia.  Her last echocardiogram was in  March 2007.  At that time her LV function was normal.  She had a small  pericardial effusion posterior to the heart.  Since I last saw her, she  feels well.  She has some dyspnea on exertion but states it has improved  with the addition of digoxin.  There is no orthopnea, PND, palpitations,  presyncope, syncope or chest pain.  She does occasionally have mild  pedal edema.   MEDICATIONS:  1. Zantac 150 mg p.o. b.i.d.  2. Tramadol 50 mg p.o. q.i.d.  3. Aspirin 81 mg daily.  4. Lipitor 20 mg daily.  5. Coumadin as directed and monitored by Dr. Drue Second.  6. Digoxin 0.125 mg daily.  7. Lisinopril 40 mg  in the morning and 20 in the evening.  8. Edecrin 25 mg four or five per week.  9. Atenolol 50 mg in the morning and 75 in the evening.  10.Multivitamin.   PHYSICAL EXAMINATION:  VITAL SIGNS:  Today shows a blood pressure of  163/91 and pulse is 74.  HEENT:  Normal.  NECK:  Supple.  CHEST:  Clear.  CARDIOVASCULAR:  Irregular rhythm.  ABDOMEN:  No tenderness.  EXTREMITIES:  Chronic skin changes and trace edema.   Electrocardiogram shows atrial fibrillation at a rate of 86.  The axis  is normal.  There are nonspecific ST-T wave changes.   DIAGNOSES:  1. Permanent atrial fibrillation - Sharon Garrison's heart rate appears      to be controlled on the present dose of digoxin and atenolol.  She      will  continue on those and we will continue with Coumadin with goal      INR of 2 to 3.  2. History of coronary disease - her most recent Myoview was low risk.      We will continue with medical therapy including her low dose      aspirin, ACE inhibitor, Statin and beta blocker.  3. History of chronic dyspnea - this has improved with the addition of      digoxin.  There may have been a component of suboptimal rate      control.  4. Coumadin therapy - this is being monitored by Dr. Drue Second.  5. Hypertension - her blood pressure is elevated today.  I have asked      her to track her blood pressure at home and keep records for [place].  I      will  see back in four weeks to review those records as well as to      see if her blood pressure cuff correlates with ours.  We will      adjust her medications based on those results.  6. Hyperlipidemia - she will continue on her Statin and this is being      monitored by Dr. Drue Second.  Our goal LDL will be less than 70 given      her history of coronary disease.  We will have those forwarded to      [place] when her next becomes available.  7. History of gastroesophageal reflux disease.   We will see her back in  four weeks for a blood pressure check as  described above.     Madolyn Frieze Jens Som, MD, [facility]  Electronically Signed    BSC/MedQ  DD: 07/24/2007  DT: 07/25/2007  Job #: [id number]   cc:   Dalbert Mayotte, M.D.

## 2010-10-11 NOTE — Assessment & Plan Note (Signed)
[facility] HEALTHCARE                            CARDIOLOGY OFFICE NOTE   Sharon Garrison, Sharon Garrison                        MRN:          [id number]  DATE:08/21/2007                            DOB:          April 23, 1941    Sharon Garrison returns for follow-up today.  She has a history of  coronary artery disease, permanent atrial fibrillation and hypertension.  Please refer to my previous notes for details.  Since I last saw her,  she has mild dyspnea on exertion.  There is no orthopnea, PND,  palpitations, presyncope, syncope or chest pain.  She states her pedal  edema is unchanged.  She has tracked her blood pressure at home and the  systolic is in the 130 range.   MEDICATION:  1. Zantac 150 mg p.o. b.i.d.  2. Tramadol.  3. Aspirin 81 mg p.o. daily.  4. Lipitor 20 mg p.o. daily.  5. Coumadin as directed.  6. Digoxin 0.25 mg p.o. daily.  7. Vesicare.  8. Lisinopril 40 mg and the morning 20 mg in the evening..  9. Edecrin 25 mg four to five times per week.  10.Atenolol 50 mg in the morning and 75 in the evening.  11.Multivitamin.   PHYSICAL EXAM:  Blood pressure of 148/86 and her pulse of 62.  She was  208 pounds.  HEENT:  Normal.  NECK:  Supple.  CHEST:  Clear.  CARDIOVASCULAR:  An irregular rhythm.  ABDOMEN:  No tenderness.  EXTREMITIES:  Trace to 1+ edema.   DIAGNOSES:  1. Permanent atrial fibrillation.  Sharon Garrison's heart rate is      controlled on her digoxin and atenolol.  We will continue with      these and she will also continue on Coumadin with a goal INR of 2-      3.  2. History of coronary artery disease.  She will continue on her      aspirin, ACE inhibitors, statin and beta blocker.  3. History of chronic dyspnea.  4. Coumadin therapy.  This is being monitored by Dr. Drue Second.  5. Hypertension.  Her blood pressure is mildly elevated today.  I did      check our cuff in comparison to hers, and hers appeared to be low      with the systolic  chronically lower at approximately 20 mmHg.  I      have asked her to begin Norvasc 5 mg daily and we will track this      and adjust as indicated.  6. Hyperlipidemia.  She will continue on her statin.  This is being      monitored by Dr. Drue Second.  7. Gastroesophageal reflux disease.   We will see her back in 8 weeks.     Madolyn Frieze Jens Som, MD, [facility]  Electronically Signed    BSC/MedQ  DD: 08/21/2007  DT: 08/22/2007  Job #: [id number]   cc:   Dalbert Mayotte, M.D.

## 2010-10-11 NOTE — Assessment & Plan Note (Signed)
[facility] HEALTHCARE                            CARDIOLOGY OFFICE NOTE   Sharon, Garrison                        MRN:          [id number]  DATE:10/09/2007                            DOB:          08-09-1940    Sharon Garrison returns for followup today.  She has a history of coronary  disease, permanent atrial fibrillation and hypertension.  Since I last  saw her she is doing reasonably well.  She has some dyspnea on exertion  which has been a chronic issue but there is no orthopnea or PND.  Her  pedal edema is unchanged.  She has not had chest pain, palpitations or  syncope.  Her blood pressure is much better on the Norvasc.  She states  her systolics are in the 125-130 range and the diastolics in the 75-80  range.   MEDICATIONS:  Include Zantac 150 mg p.o. b.i.d., tramadol 50 mg p.o.  q.i.d., aspirin 81 mg daily, Lipitor 20 mg p.o. daily, Coumadin as  directed and followed by Dr. Drue Second, digoxin 0.25 mg daily, VESIcare,  lisinopril 40 mg in the in the morning and 20 in the evening, Edecrin 25  mg 4-5 times per week, atenolol 50 mg in the morning and 75 mg in the  evening, multivitamin, amlodipine 5 mg p.o. daily.   PHYSICAL EXAM:  VITAL SIGNS:  Blood pressure of 128/78 and her pulse is  63.  HEENT:  Normal.  Neck is supple.  Chest is clear.  CARDIOVASCULAR:  Irregular rhythm.  Her abdominal exam shows no tenderness.  EXTREMITIES:  Show 1+ ankle edema.   Her electrocardiogram shows atrial fibrillation at a rate of 63.  There  are nonspecific ST changes.   DIAGNOSES:  1. Permanent atrial fibrillation - her rate remains controlled.  She      will continue on her atenolol as well as her digoxin.  She will      also continue on Coumadin with a goal INR of 2-3, and this is being      managed by Dr. Drue Second.  2. History of coronary disease - she will continue on her aspirin,      angiotensin-converting enzyme inhibitor, statin and beta-blocker.      She  did have a Myoview performed in April of 2008 that showed      breast attenuation but no ischemia or scarring.  Her ejection      fraction was 76%.  3. Hypertension - her blood pressure is much better on her present      medications and she will continue on her Norvasc.  4. Hyperlipidemia - she will continue on her statin and this is being      monitored by Dr. Drue Second.  5. Gastroesophageal reflux disease.   We will see her back in 9 months.     Madolyn Frieze Jens Som, MD, [facility]  Electronically Signed    BSC/MedQ  DD: 10/09/2007  DT: 10/09/2007  Job #: [id number]   cc:   Dalbert Mayotte, M.D.

## 2010-10-14 NOTE — Assessment & Plan Note (Signed)
[facility] HEALTHCARE                            CARDIOLOGY OFFICE NOTE   Sharon Garrison, Sharon Garrison                        MRN:          [id number]  DATE:04/25/2006                            DOB:          December 01, 1940    Sharon Garrison returns for followup today.  Please refer to my previous  notes for details.  Since I last saw her, she does have some dyspnea on  exertion but is mildly improved.  There is no orthopnea or PND.  There  is mild pedal edema.  She has had no chest pain, palpitations, or  syncope.   Her medications include:  1. Zantac 150 mg p.o. b.i.d.  2. Lisinopril 20 mg p.o. b.i.d.  3. Tramadol hydrochloride 50 mg p.o. q.i.d.  4. Aspirin 81 mg p.o. daily.  5. Lipitor 20 mg p.o. daily.  6. Atenolol 50 mg p.o. b.i.d.  7. Coumadin as directed.  8. Magnesium.   PHYSICAL EXAM:  Shows a blood pressure of 130/80.  Her heart rate is 81.  NECK:  Supple with no bruits.  CHEST:  Clear.  CARDIOVASCULAR:  Irregular rhythm.  EXTREMITIES:  Showed trace edema.   Her electrocardiogram shows atrial fibrillation at a rate of 81.  There  are no significant ST changes noted.   DIAGNOSES:  1. History of dyspnea on exertion.  2. History of coronary disease.  3. Atrial fibrillation, mostly likely permanent.  4. Coumadin therapy.  5. Hypertension.  6. Hyperlipidemia.  7. History of gastroesophageal reflux disease.   PLAN:  Sharon Garrison continues to have some dyspnea on exertion,  although somewhat improved.  Note, her previous echocardiogram performed  on August 09, 2005, showed normal LV function.  She also had a Holter  monitor that showed a mildly elevated heart rate at times and we did  increase her atenolol from 50 mg p.o. daily to 50 mg p.o. b.i.d.  I  wonder whether her atrial fibrillation may be contributing to her  dyspnea, although it seems to be more chronic.  We have not yet obtained  records from her physicians in [place] and she will continue to  try to  do this.  We can review these and can get a cardioversion to see if this  would improve her  symptoms.  She will otherwise continue on her present medications as her  blood pressures are well controlled today.  I will see her back in 6  weeks to review her symptoms and her records.     Madolyn Frieze Jens Som, MD, [facility]  Electronically Signed    BSC/MedQ  DD: 04/25/2006  DT: 04/26/2006  Job #: [id number]   cc:   Dalbert Mayotte, M.D.

## 2010-10-14 NOTE — Assessment & Plan Note (Signed)
[facility] HEALTHCARE                            CARDIOLOGY OFFICE NOTE   TYRENA, GOHR                        MRN:          [id number]  DATE:06/25/2006                            DOB:          Dec 07, 1940    June 25, 2006   Mrs. Nebel returns for follow-up today.  Since I last saw her, she  does have some dyspnea on exertion which apparently has been a chronic  issue.  There is no orthopnea, PND and her pedal edema has improved on  her Edecrin.  There is no chest pain, palpitations, or syncope.  Note  previously she did have a severe rash with the addition of Lasix.   PRESENT MEDICATIONS:  1. Zantac 150 mg p.o. b.i.d.  2. Lisinopril 20 mg p.o. b.i.d.  3. Tramadol HCL 50 mg p.o. q.i.d.  4. Aspirin 81 mg p.o. daily.  5. Lipitor 20 mg p.o. daily.  6. Atenolol 50 mg p.o. b.i.d.  7. Coumadin.  8. Magnesium.  9. Edecrin.   PHYSICAL EXAMINATION:  VITAL SIGNS:  Blood pressure 130/86, pulse 105.  CHEST:  Clear.  CARDIOVASCULAR:  There is an irregular rhythm.  EXTREMITIES:  Chronic skin changes and trace edema.   Electrocardiogram shows atrial fibrillation at a rate of 105.  There are  nonspecific ST changes.   DIAGNOSES:  1. Permanent atrial fibrillation.  2. History of coronary artery disease.  3. Chronic dyspnea.  4. Coumadin therapy.  5. Hypertension.  6. Hyperlipidemia.  7. History of gastroesophageal reflux disease.   PLAN:  Mrs. Duman has continued to have dyspnea on exertion, which  apparently has been going on for 7-10 years.  Her edema has improved on  the Edecrin.  Note she did have a rash with Lasix.  I think rate control  with anticoagulation is the best option for her atrial fibrillation.  We  will continue with her atenolol.  I will have her proceed with a  CardioNet monitor.  If her heart rate is indeed elevated, then we will  add digoxin and continue to follow with the monitor and  adjust as indicated.  Her LV function  has been normal.  I will see her  back in [place], [place], in three months.     Madolyn Frieze Jens Som, MD, [facility]  Electronically Signed    BSC/MedQ  DD: 06/25/2006  DT: 06/25/2006  Job #: [id number]   cc:   Dalbert Mayotte, M.D.

## 2010-10-14 NOTE — Assessment & Plan Note (Signed)
[facility] HEALTHCARE                            CARDIOLOGY OFFICE NOTE   GAYL, IVANOFF                        MRN:          [id number]  DATE:09/12/2006                            DOB:          09-08-1940    Mrs. Akopyan returns for followup.  She has a history of coronary  disease, permanent atrial fibrillation and hypertension.  Since I last  saw her, we did add digoxin to her medical regimen as her heart rate was  noted to be elevated on the monitor.  She states that her dyspnea has  improved somewhat.  However, she still has dyspnea with going up stairs  and with more moderate activities but not with routine activities around  the house.  There is no orthopnea or PND and her pedal edema is  unchanged.  She has not had chest pain, palpitations or syncope.   Her medications include:  1. Zantac 150 mg p.o. b.i.d.  2. Lisinopril 20 mg p.o. b.i.d.  3. Tramadol 50 mg p.o. q.i.d.  4. Aspirin 81 mg p.o. daily.  5. Lipitor 20 mg p.o. daily.  6. Atenolol 50 mg p.o. b.i.d.  7. Coumadin.  8. Magnesium 250 mg p.o. daily.  9. Edecrin 25 mg p.o. daily.  10.Digoxin 0.25 mg p.o. daily.  11.Vesicare.   Her physical exam today shows a heart rate of 53.  Her neck is supple.  There are no bruits.  Her chest is clear.  Her cardiovascular exam reveals an irregular rhythm.  EXTREMITIES:  Show chronic skin changes and there is 1+ edema.   Her electrocardiogram today shows atrial fibrillation at a rate of 53.  There are nonspecific ST changes.   DIAGNOSES:  1. Permanent atrial fibrillation, her rate is much better controlled      and we will continue with the present dose of atenolol and digoxin.      She will continue on Coumadin with a goal INR of 2 to 3, and this      is being followed by Dr. Drue Second.  2. History of coronary disease - we will plan to risk stratify with an      adenosine Myoview.  If it shows normal perfusion we will continue      with medical  therapy.  Note we will continue with her aspirin at 81      mg p.o. daily, her ACE-inhibitor, her statin and her beta blocker.  3. History of chronic dyspnea.  4. Coumadin therapy.  5. Hypertension - her blood pressure appears to be well controlled on      her present medications.  6. Hyperlipidemia - she is to have lipids and liver drawn by Dr.      Drue Second in the near future and we will have them imported to [place] for      our records.  Her goal LDL should be less than 70 given her history      of coronary disease.  7. History of gastroesophageal reflux disease.  8. We will see her back in 6 months.     Madolyn Frieze Crenshaw,  MD, [facility]  Electronically Signed    BSC/MedQ  DD: 09/12/2006  DT: 09/12/2006  Job #: [id number]   cc:   Dalbert Mayotte, M.D.

## 2011-01-10 ENCOUNTER — Encounter: Payer: Self-pay | Admitting: Cardiology

## 2011-05-23 IMAGING — CT CT ABD-PELV W/ CM
3 of 5 series · 12 of 36 positions shown, 18 images · IV contrast (READICAT/WATER & [ID] OMNI 350)
Comparison: 03/10/2008

CLINICAL DATA: Enlarging periumbilical mass.

CT ABDOMEN AND PELVIS WITH CONTRAST
TECHNIQUE: Multidetector CT imaging of the abdomen and pelvis was
performed following the standard protocol during bolus
administration of intravenous contrast.
Contrast: 125 ml Nmnipaque-YQQ

[Series 3: routine abdomen · axial · 0.70mm/px · z∈[-340,-15]mm · 7 of 87 slices shown, 12 images]
[im 11/87  soft-tissue]
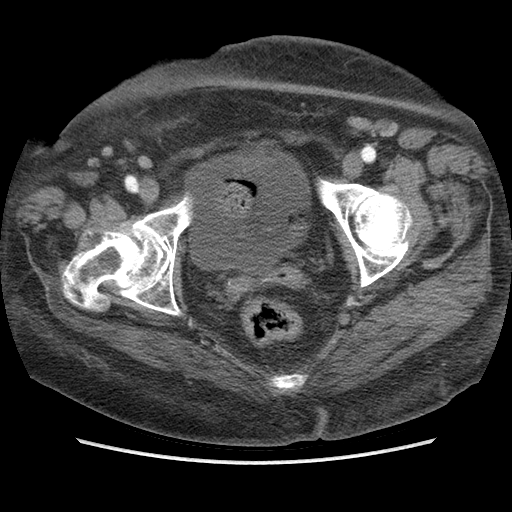
[im 11/87  bone]
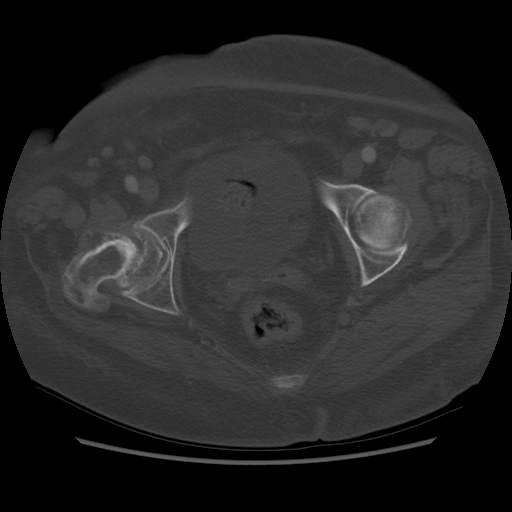
[im 22/87  soft-tissue]
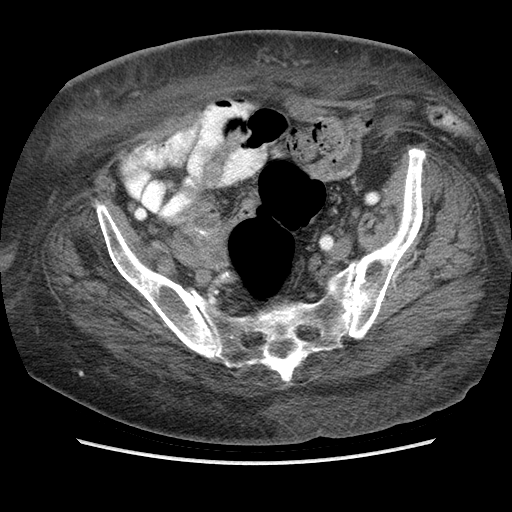
[im 33/87  soft-tissue]
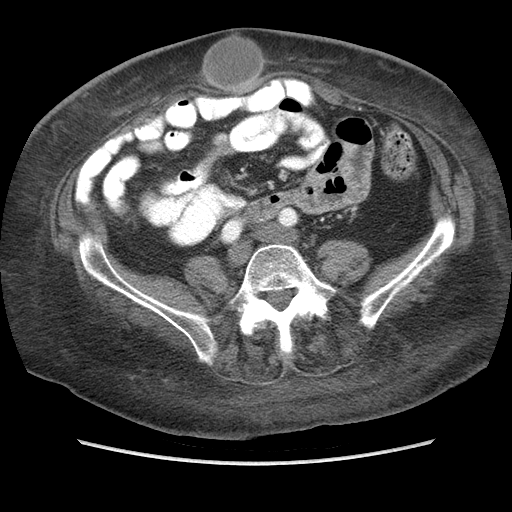
[im 44/87  soft-tissue]
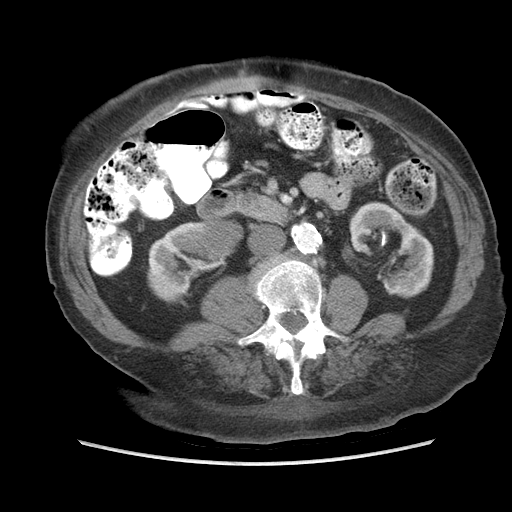
[im 44/87  lung]
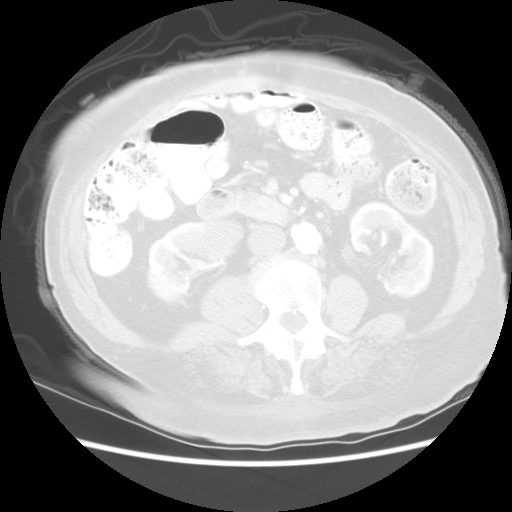
[im 54/87  soft-tissue]
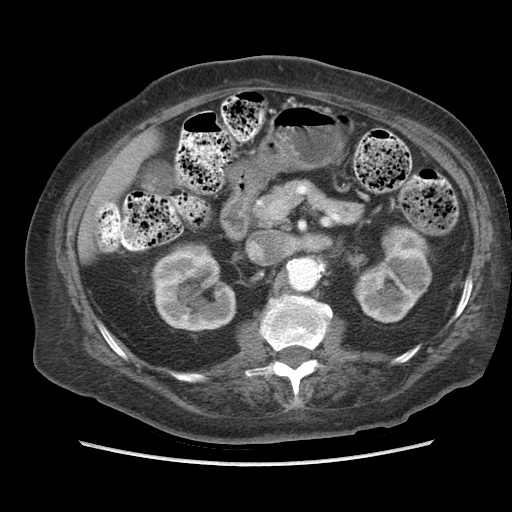
[im 54/87  lung]
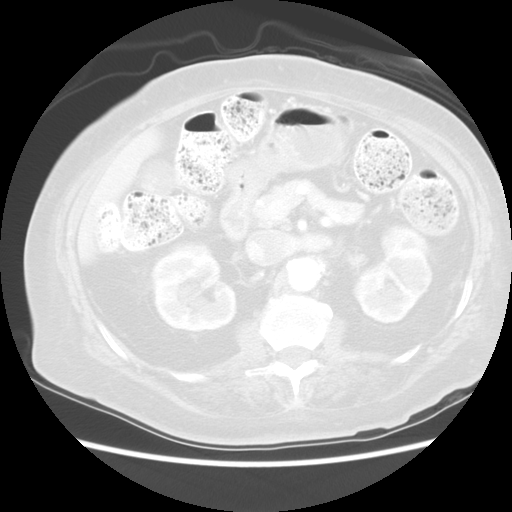
[im 65/87  soft-tissue]
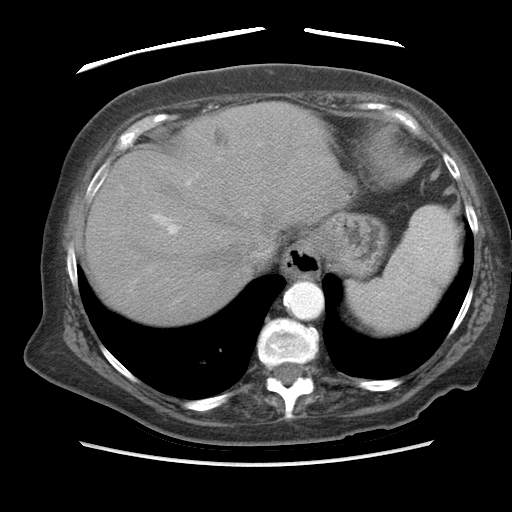
[im 65/87  lung]
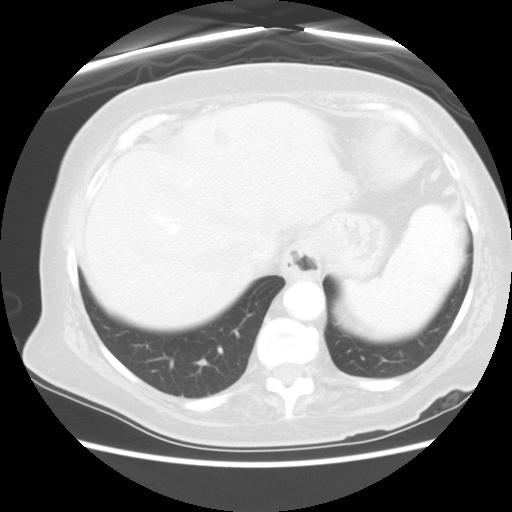
[im 76/87  soft-tissue]
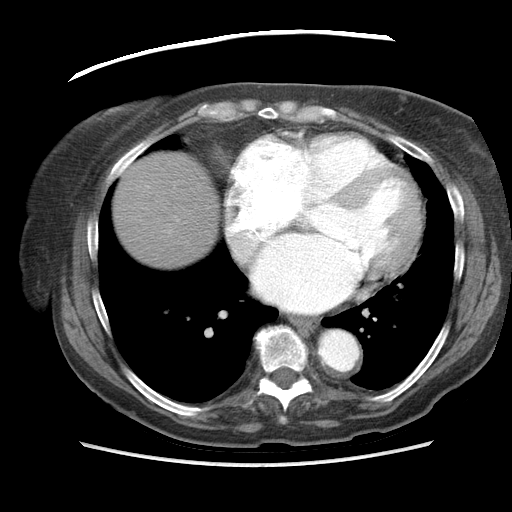
[im 76/87  lung]
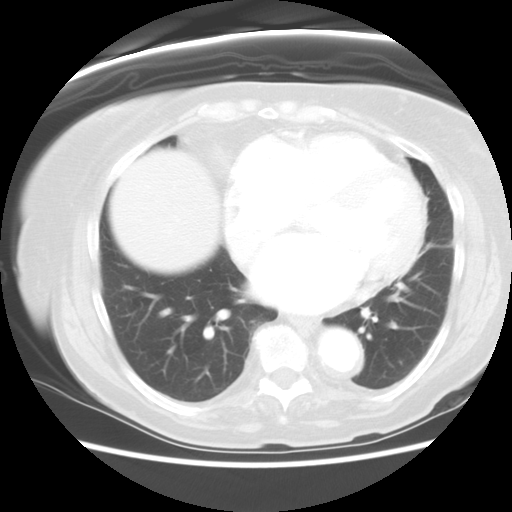

[Series 601: coronal body · coronal · 0.87mm/px · 1 of 122 slices shown, 2 images]
[im 41/122  soft-tissue]
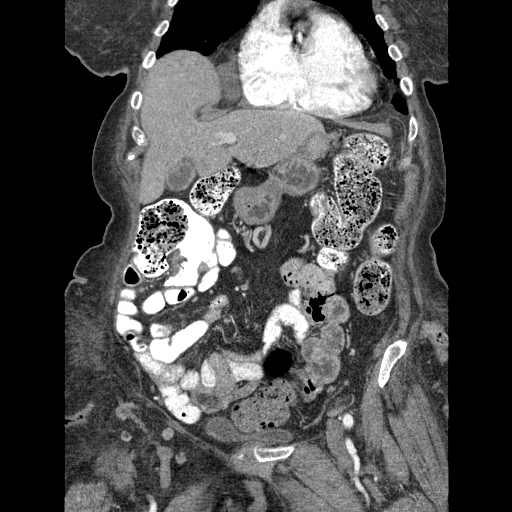
[im 41/122  bone]
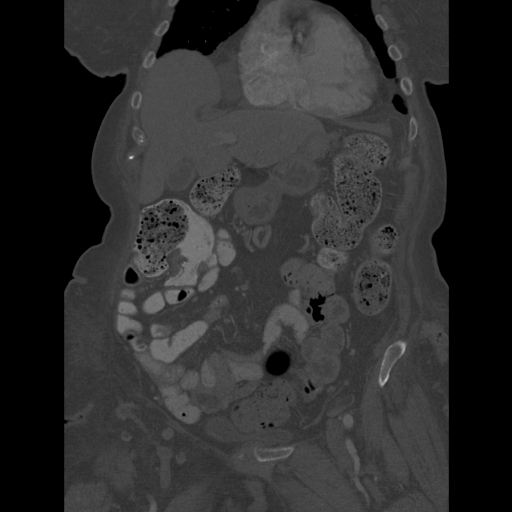

[Series 602: sagittal body · sagittal · 0.87mm/px · 4 of 145 slices shown]
[im 10/145  soft-tissue]
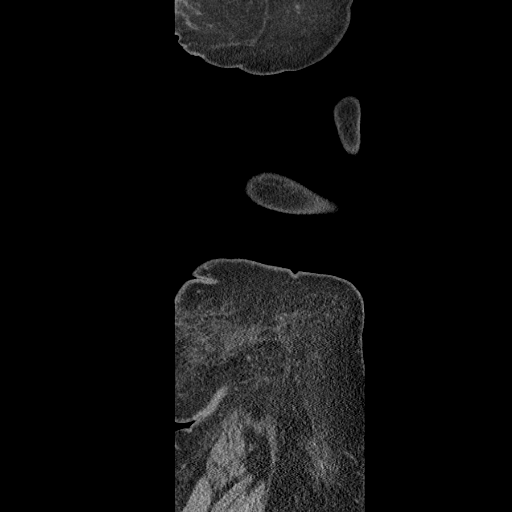
[im 29/145  soft-tissue]
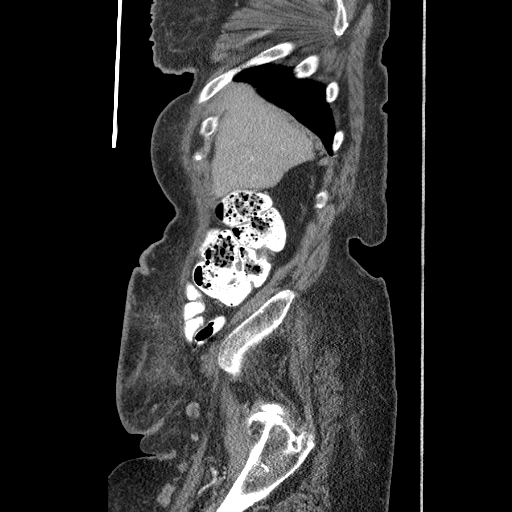
[im 49/145  soft-tissue]
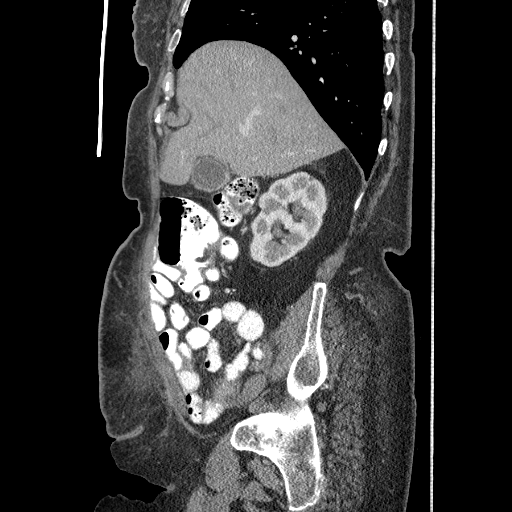
[im 68/145  soft-tissue]
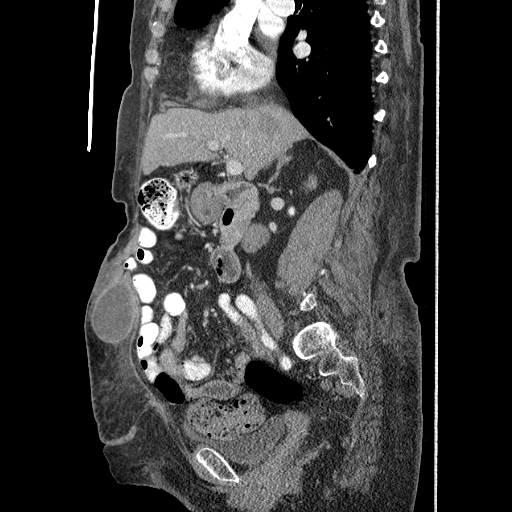

[12 of 36 positions shown; findings below may reference images not displayed]

FINDINGS: There is a 5.0 x 4.2 x 3.6 cm well defined cystic
appearing lesion in the subcutaneous tissues of the anterior
abdominal wall just below the level of the umbilicus, unchanged in
size and configuration since the prior exam.  Given the fact that
it has not resolved in the interval, this may not represent a
seroma but may represent a large inclusion cyst.  It has a benign
appearance.

The patient has a spigelian hernia in the left lower quadrant
containing a portion of the sigmoid colon, unchanged since the
prior exam.  There is no evidence of bowel obstruction.

The liver, spleen, pancreas, adrenal glands, and kidneys are
stable.  There is a solitary stone in the gallbladder measuring 3
cm in diameter.  There is no biliary ductal dilatation.  There is a
high density cyst in the lower pole of the right kidney which has
increased in size and now measures 4.2 cm in diameter but shows no
enhancement on delayed imaging and probably represents a
proteinaceous cyst.

There is no adenopathy.  There is extensive calcification in the
abdominal aorta.

There is edema in the subcutaneous fat of the abdomen and
anteriorly and posteriorly, increased since the prior study.

Uterus and ovaries appear to have been removed. There is mild
cardiomegaly.

No acute bony abnormalities. Severe osteoarthritis of the right
hip, unchanged.
IMPRESSION: 1.  Stable appearance of the cystic mass in the subcutaneous
tissues just below the umbilicus.  It has a benign appearance.  I
would expect a seroma to have resolved.  This could represent an
unusual inclusion cyst.
2.  Spigelian hernia containing sigmoid colon, unchanged.

## 2011-08-25 ENCOUNTER — Encounter: Payer: Self-pay | Admitting: Emergency Medicine

## 2011-08-25 ENCOUNTER — Emergency Department (INDEPENDENT_AMBULATORY_CARE_PROVIDER_SITE_OTHER)
Admission: EM | Admit: 2011-08-25 | Discharge: 2011-08-25 | Disposition: A | Discharge status: Home / Self Care | Payer: Medicare Other | Source: Home / Self Care

## 2011-08-25 DIAGNOSIS — S91009A Unspecified open wound, unspecified ankle, initial encounter: Secondary | ICD-10-CM

## 2011-08-25 NOTE — ED Provider Notes (Signed)
History     CSN: [id number]  Arrival date & time 08/25/11  1429   First MD Initiated Contact with Patient 08/25/11 1431      Chief Complaint  Patient presents with  . Laceration    (Consider location/radiation/quality/duration/timing/severity/associated sxs/prior treatment) HPI Comments: Pt currently on coumadin for afib. Cut initially bled for 15-30 mins and then resolved. No bleeding since this point. Area has been persistently leaking clear fluid. Pt unsure of last INR.  Pt denies any diuretic or norvasc use.   Patient is a 71 y.o. female presenting with skin laceration. The history is provided by the patient and a relative.  Laceration  The incident occurred yesterday. The laceration is located on the right leg. The laceration is 3 cm in size. The laceration mechanism was a a blunt object (jammed her leg coming out of car door ). The pain is at a severity of 3/10. The pain is mild. Her tetanus status is UTD.    Past Medical History  Diagnosis Date  . Atrial fibrillation   . CAD (coronary artery disease)     prior PCI of LAD and Lcx; [place]; Jan 2004  . HLD (hyperlipidemia)   . HTN (hypertension)   . GERD (gastroesophageal reflux disease)     and esophageal ulcerations  . Osteoarthritis   . FHx: migraine headaches   . Fibromyalgia   . TIA (transient ischemic attack)     Past Surgical History  Procedure Date  . Abdominal surgery     for small bowel obstruction  . Prior d&c   . Hysterectomy (other)   . Hernia repair     Family History  Problem Relation Age of Onset  . Heart failure Mother   . Heart attack Brother     History  Substance Use Topics  . Smoking status: Never Smoker   . Smokeless tobacco: Not on file  . Alcohol Use: No    OB History    Grav Para Term Preterm Abortions TAB SAB Ect Mult Living                  Review of Systems  Constitutional: Negative for fever and chills.  Respiratory: Negative for chest tightness and shortness of  breath.   Cardiovascular: Negative for chest pain.    Allergies  Penicillins and Sulfonamide derivatives  Home Medications   Current Outpatient Rx  Name Route Sig Dispense Refill  . AMLODIPINE BESYLATE 10 MG PO TABS Oral Take 10 mg by mouth daily.      . ASPIRIN 81 MG PO TABS Oral Take 81 mg by mouth daily.      . ATENOLOL 25 MG PO TABS Oral Take 25 mg by mouth daily.      . ATORVASTATIN CALCIUM 40 MG PO TABS Oral Take 40 mg by mouth daily.      Marland Kitchen VITAMIN B 12 PO Oral Take by mouth every 30 (thirty) days.      Marland Kitchen GABAPENTIN 600 MG PO TABS Oral Take 600 mg by mouth 3 (three) times daily.      Marland Kitchen HYDROCORTISONE ACETATE 25 MG RE SUPP Rectal Place 25 mg rectally as needed.      Marland Kitchen LISINOPRIL 40 MG PO TABS Oral Take 40 mg by mouth daily.      Marland Kitchen MAGNESIUM OXIDE 400 MG PO TABS Oral Take by mouth daily.      Ria Clock M PO TABS Oral Take by mouth daily.      Marland Kitchen NITROGLYCERIN  0.4 MG SL SUBL Sublingual Place 0.4 mg under the tongue every 5 (five) minutes as needed.      Marland Kitchen RANITIDINE HCL 150 MG PO CAPS Oral Take 150 mg by mouth 2 (two) times daily.      Marland Kitchen SOLIFENACIN SUCCINATE 10 MG PO TABS Oral Take 5 mg by mouth daily.      . TRAMADOL HCL 50 MG PO TABS Oral Take 50 mg by mouth as needed.      . WARFARIN SODIUM 5 MG PO TABS Oral Take 5 mg by mouth daily.        BP 122/81  Pulse 66  Temp(Src) 97.7 F (36.5 C) (Oral)  Resp 18  Ht 5\' 3"  (1.6 m)  Wt 197 lb (89.359 kg)  BMI 34.90 kg/m2  SpO2 96%  Physical Exam  Constitutional:       Obese, NAD   HENT:  Head: Normocephalic and atraumatic.  Eyes: Conjunctivae and EOM are normal. Pupils are equal, round, and reactive to light.  Neck: Normal range of motion.  Cardiovascular: Normal rate and regular rhythm.   Pulmonary/Chest: Effort normal and breath sounds normal.  Abdominal:       Obese, non tender   Musculoskeletal:       Marked bilateral LE edema with secondary transudative fluid weeping/leaking from laceration site. No active bleeding.  No significant peripehral erythema, warmth to touch, purulent drainage concerning for infection.   Skin:       ED Course  Procedures (including critical care time)  Labs Reviewed - No data to display No results found.   No diagnosis found.    MDM  R lower leg laceration in setting of marked LE edema: area cleansed with iodine. Neosporin placed. Mepilex absorbent dressing placed. Compressive unna boot placed. Handout given on edema and unna boot. Infectious and vascular red flags reviewed. Follow up with PCP on 08/28/11. Leg elevation and low salt diet.        Floydene Flock, MD 08/25/11 [phone]

## 2011-08-25 NOTE — ED Notes (Signed)
Caught right calf of lower right leg on corner of car door yesterday; reports lots of bleeding and concern since she is on Coumadin for phlebitis.

## 2011-08-25 NOTE — Discharge Instructions (Signed)
Edema Edema is an abnormal build-up of fluids in tissues. Because this is partly dependent on gravity (water flows to the lowest place), it is more common in the leg sand thighs (lower extremities). It is also common in the looser tissues, like around the eyes. Painless swelling of the feet and ankles is common and increases as a person ages. It may affect both legs and may include the calves or even thighs. When squeezed, the fluid may move out of the affected area and may leave a dent for a few moments. CAUSES   Prolonged standing or sitting in one place for extended periods of time. Movement helps pump tissue fluid into the veins, and absence of movement prevents this, resulting in edema.   Varicose veins. The valves in the veins do not work as well as they should. This causes fluid to leak into the tissues.   Fluid and salt overload.   Injury, burn, or surgery to the leg, ankle, or foot, may damage veins and allow fluid to leak out.   Sunburn damages vessels. Leaky vessels allow fluid to go out into the sunburned tissues.   Allergies (from insect bites or stings, medications or chemicals) cause swelling by allowing vessels to become leaky.   Protein in the blood helps keep fluid in your vessels. Low protein, as in malnutrition, allows fluid to leak out.   Hormonal changes, including pregnancy and menstruation, cause fluid retention. This fluid may leak out of vessels and cause edema.   Medications that cause fluid retention. Examples are sex hormones, blood pressure medications, steroid treatment, or anti-depressants.   Some illnesses cause edema, especially heart failure, kidney disease, or liver disease.   Surgery that cuts veins or lymph nodes, such as surgery done for the heart or for breast cancer, may result in edema.  DIAGNOSIS  Your caregiver is usually easily able to determine what is causing your swelling (edema) by simply asking what is wrong (getting a history) and examining  you (doing a physical). Sometimes x-rays, EKG (electrocardiogram or heart tracing), and blood work may be done to evaluate for underlying medical illness. TREATMENT  General treatment includes:  Leg elevation (or elevation of the affected body part).   Restriction of fluid intake.   Prevention of fluid overload.   Compression of the affected body part. Compression with elastic bandages or support stockings squeezes the tissues, preventing fluid from entering and forcing it back into the blood vessels.   Diuretics (also called water pills or fluid pills) pull fluid out of your body in the form of increased urination. These are effective in reducing the swelling, but can have side effects and must be used only under your caregiver's supervision. Diuretics are appropriate only for some types of edema.  The specific treatment can be directed at any underlying causes discovered. Heart, liver, or kidney disease should be treated appropriately. HOME CARE INSTRUCTIONS   Elevate the legs (or affected body part) above the level of the heart, while lying down.   Avoid sitting or standing still for prolonged periods of time.   Avoid putting anything directly under the knees when lying down, and do not wear constricting clothing or garters on the upper legs.   Exercising the legs causes the fluid to work back into the veins and lymphatic channels. This may help the swelling go down.   The pressure applied by elastic bandages or support stockings can help reduce ankle swelling.   A low-salt diet may help reduce fluid  retention and decrease the ankle swelling.   Take any medications exactly as prescribed.  SEEK MEDICAL CARE IF:  Your edema is not responding to recommended treatments. SEEK IMMEDIATE MEDICAL CARE IF:   You develop shortness of breath or chest pain.   You cannot breathe when you lay down; or if, while lying down, you have to get up and go to the window to get your breath.   You  are having increasing swelling without relief from treatment.   You develop a fever over 102 F (38.9 C).   You develop pain or redness in the areas that are swollen.   Tell your caregiver right away if you have gained 3 lb/1.4 kg in 1 day or 5 lb/2.3 kg in a week.  MAKE SURE YOU:   Understand these instructions.   Will watch your condition.   Will get help right away if you are not doing well or get worse.  Document Released: 05/15/2005 Document Revised: 05/04/2011 Document Reviewed: 01/01/2008 [facility] Patient Information 2012 [place], [place].  Discharge Instructions: Henriette Combs You will be going home with an Unna boot in place. An Unna boot is a dressing and wrap combination that is applied from your foot to your knee. An Radio broadcast assistant has a special medication in the gauze that will help heal burns or skin sores and protect new skin. There are two kinds of bandages. A white bandage is changed every 1 to 3 days. In most cases, the pink one is changed once a week. You will need to visit your doctor to have the Unna boot changed. Here's what you need to know about home care. Home Care Remember, it is normal to have some drainage from the [facility] boot dressing. Don't be alarmed if the drainage smells bad. The dressing pulls drainage from the wound into the dressing. The odor you smell is from the dressing, not the wound. Don't get your Advertising account executive. Cover the dressing completely with a plastic bag or plastic wrap before taking a shower. Tape the plastic to the skin above and below the dressing. If you want to take a tub bath, leave the limb with the Unna boot on the side of the tub and out of the water. Keep the rest of your skin clean. Every day, wash any other burns or sores not covered by a dressing. Avoid standing or sitting in the same position for more than 30 minutes at a time. Keep your legs elevated as much as possible. Follow-Up Make a follow-up appointment as directed by our  staff. When to Call Your Doctor Call your doctor right away if you have any of the following: Tingling or numbness in the injured body part Severe pain that cannot be relieved Swelling, coldness, or blue-gray color in the fingers or toes Unna boot that feels too tight or too loose Unna boot that is damaged or has rough edges that hurt Unna boot that gets wet Drainage from [employer] dressing that smells different than usual

## 2011-08-25 NOTE — ED Provider Notes (Signed)
Patient seen by Dr. Alvester Morin.  Marlaine Hind, MD 08/25/11 1540

## 2011-12-19 ENCOUNTER — Encounter: Payer: Self-pay | Admitting: Cardiology

## 2011-12-20 ENCOUNTER — Ambulatory Visit (INDEPENDENT_AMBULATORY_CARE_PROVIDER_SITE_OTHER): Payer: Medicare Other | Admitting: Cardiology

## 2011-12-20 ENCOUNTER — Encounter: Payer: Self-pay | Admitting: Cardiology

## 2011-12-20 VITALS — BP 132/90 | HR 103 | Wt 185.0 lb

## 2011-12-20 DIAGNOSIS — I10 Essential (primary) hypertension: Secondary | ICD-10-CM

## 2011-12-20 DIAGNOSIS — I2510 Atherosclerotic heart disease of native coronary artery without angina pectoris: Secondary | ICD-10-CM

## 2011-12-20 DIAGNOSIS — I4891 Unspecified atrial fibrillation: Secondary | ICD-10-CM

## 2011-12-20 DIAGNOSIS — E7800 Pure hypercholesterolemia, unspecified: Secondary | ICD-10-CM

## 2011-12-20 DIAGNOSIS — R0602 Shortness of breath: Secondary | ICD-10-CM

## 2011-12-20 MED ORDER — ATENOLOL 25 MG PO TABS: ORAL_TABLET | ORAL | Status: DC

## 2011-12-20 NOTE — Assessment & Plan Note (Signed)
Discontinue aspirin given need for Coumadin. Continue statin. Repeat Myoview in one year.

## 2011-12-20 NOTE — Patient Instructions (Addendum)
Your physician recommends that you schedule a follow-up appointment in: 3 MONTHS WITH DR CRENSHAW IN [place]  INCREASE ATENOLOL TO 25 MG TAKE TWO TABLETS EVERY MORNING AND ONE TABLET EVERY EVENING  Your physician has requested that you have an echocardiogram. Echocardiography is a painless test that uses sound waves to create images of your heart. It provides your doctor with information about the size and shape of your heart and how well your heart's chambers and valves are working. This procedure takes approximately one hour. There are no restrictions for this procedure.   STOP ASPIRIN  XARELTO=PRADAXA=ELIQUIS

## 2011-12-20 NOTE — Assessment & Plan Note (Signed)
Blood pressure is elevated. Increase atenolol to 50 mg in the morning and 25 mg in the evening. If her blood pressure remains elevated we may resume amlodipine. Her lisinopril was discontinued because of possible angioedema/lip swelling.

## 2011-12-20 NOTE — Assessment & Plan Note (Signed)
Patient remains in permanent atrial fibrillation. Her heart rate is elevated. Increase atenolol to 50 mg in the morning and 25 mg in the evening. Continue Coumadin. I had given her the names of xeralto, pradaxa and apixiban. She will check with her insurance company to see if they will cover. If so we will plan to discontinue her Coumadin and begin one of these medications assuming her renal function is normal. I will have laboratories from Dr. Feliz Beam office forwarded to [place] to review. She has noted some increased dyspnea on exertion. Repeat echocardiogram.

## 2011-12-20 NOTE — Progress Notes (Signed)
HPI: Ms. Doepke is a very pleasant  female with coronary artery disease, permanent atrial fibrillation, hypertension. Last Myoview was performed in June of 2011. There was breast attenuation but no scar or ischemia and ejection fraction was 66%. Last echocardiogram in March 2007 showed normal LV function and a small posterior pericardial effusion. Note previous admission at outside hospital for TIA; also noted to be bradycardic and digoxin DCed and atenolol decreased. F/U holter showed her rate was controlled.  I last saw her in Feb 2012. Since then, the patient recently had abdominal surgery which required a stay in rehabilitation to improve her strength. Prior to that her lisinopril was discontinued because of lip swelling. She has noted increased dyspnea on exertion. There is no orthopnea or PND. She has chronic pedal edema. No chest pain, palpitations or syncope. Her blood pressure has been running high since her lisinopril and amlodipine were discontinued.  Current Outpatient Prescriptions  Medication Sig Dispense Refill  . aspirin 81 MG tablet Take 81 mg by mouth daily.        Marland Kitchen atenolol (TENORMIN) 25 MG tablet Take 25 mg by mouth 2 (two) times daily.       Marland Kitchen atorvastatin (LIPITOR) 40 MG tablet Take 40 mg by mouth daily.        . Cyanocobalamin (VITAMIN B 12 PO) Take by mouth every 30 (thirty) days.        Marland Kitchen gabapentin (NEURONTIN) 100 MG capsule 2 tabs po qd      . Multiple Vitamins-Minerals (THEREMS M) TABS Take by mouth daily.        . nitroGLYCERIN (NITROSTAT) 0.4 MG SL tablet Place 0.4 mg under the tongue every 5 (five) minutes as needed.        Marland Kitchen oxybutynin (DITROPAN-XL) 10 MG 24 hr tablet Take 10 mg by mouth daily.      . ranitidine (ZANTAC) 150 MG capsule Take 150 mg by mouth 2 (two) times daily.        Marland Kitchen warfarin (COUMADIN) 5 MG tablet Take 5 mg by mouth daily.           Past Medical History  Diagnosis Date  . Atrial fibrillation   . CAD (coronary artery disease)     prior  PCI of LAD and Lcx; [place]; Jan 2004  . HLD (hyperlipidemia)   . HTN (hypertension)   . GERD (gastroesophageal reflux disease)     and esophageal ulcerations  . Osteoarthritis   . FHx: migraine headaches   . Fibromyalgia   . TIA (transient ischemic attack)     Past Surgical History  Procedure Date  . Abdominal surgery     for small bowel obstruction  . Prior d&c   . Hysterectomy (other)   . Hernia repair     History   Social History  . Marital Status: Divorced    Spouse Name: N/A    Number of Children: N/A  . Years of Education: N/A   Occupational History  . Not on file.   Social History Main Topics  . Smoking status: Never Smoker   . Smokeless tobacco: Not on file  . Alcohol Use: No  . Drug Use:   . Sexually Active:    Other Topics Concern  . Not on file   Social History Narrative  . No narrative on file    ROS: no fevers or chills, productive cough, hemoptysis, dysphasia, odynophagia, melena, hematochezia, dysuria, hematuria, rash, seizure activity, orthopnea, PND, claudication. Remaining systems are negative.  Physical Exam: Well-developed obese in no acute distress.  Skin is warm and dry.  HEENT is normal.  Neck is supple.  Chest is clear to auscultation with normal expansion.  Cardiovascular exam is irregular Abdominal exam nontender or distended. No masses palpated. Extremities show 2+ edema and chronic skin changes. neuro grossly intact  ECG atrial fibrillation at a rate of 103. Nonspecific ST changes.

## 2011-12-20 NOTE — Assessment & Plan Note (Signed)
Continue statin. 

## 2011-12-25 ENCOUNTER — Ambulatory Visit (HOSPITAL_COMMUNITY): Payer: Medicare Other | Attending: Cardiology | Admitting: Radiology

## 2011-12-25 DIAGNOSIS — I379 Nonrheumatic pulmonary valve disorder, unspecified: Secondary | ICD-10-CM | POA: Insufficient documentation

## 2011-12-25 DIAGNOSIS — I2510 Atherosclerotic heart disease of native coronary artery without angina pectoris: Secondary | ICD-10-CM | POA: Insufficient documentation

## 2011-12-25 DIAGNOSIS — R0609 Other forms of dyspnea: Secondary | ICD-10-CM | POA: Insufficient documentation

## 2011-12-25 DIAGNOSIS — I4891 Unspecified atrial fibrillation: Secondary | ICD-10-CM | POA: Insufficient documentation

## 2011-12-25 DIAGNOSIS — R0602 Shortness of breath: Secondary | ICD-10-CM

## 2011-12-25 DIAGNOSIS — E785 Hyperlipidemia, unspecified: Secondary | ICD-10-CM | POA: Insufficient documentation

## 2011-12-25 DIAGNOSIS — I059 Rheumatic mitral valve disease, unspecified: Secondary | ICD-10-CM | POA: Insufficient documentation

## 2011-12-25 DIAGNOSIS — I079 Rheumatic tricuspid valve disease, unspecified: Secondary | ICD-10-CM | POA: Insufficient documentation

## 2011-12-25 DIAGNOSIS — I10 Essential (primary) hypertension: Secondary | ICD-10-CM | POA: Insufficient documentation

## 2011-12-25 DIAGNOSIS — R0989 Other specified symptoms and signs involving the circulatory and respiratory systems: Secondary | ICD-10-CM | POA: Insufficient documentation

## 2011-12-25 NOTE — Progress Notes (Signed)
Echocardiogram performed.  

## 2012-02-22 ENCOUNTER — Ambulatory Visit (HOSPITAL_COMMUNITY): Payer: Medicare Other | Admitting: Psychiatry

## 2012-02-29 ENCOUNTER — Ambulatory Visit (INDEPENDENT_AMBULATORY_CARE_PROVIDER_SITE_OTHER): Payer: Medicare Other | Admitting: Psychiatry

## 2012-02-29 ENCOUNTER — Encounter (HOSPITAL_COMMUNITY): Payer: Self-pay | Admitting: Psychiatry

## 2012-02-29 VITALS — BP 157/100 | HR 76 | Ht 63.0 in | Wt 183.0 lb

## 2012-02-29 DIAGNOSIS — F0630 Mood disorder due to known physiological condition, unspecified: Secondary | ICD-10-CM

## 2012-02-29 NOTE — Progress Notes (Signed)
Psychiatric Assessment Adult  Patient Identification:  Sharon Garrison Date of Evaluation:  02/29/2012 Chief Complaint:  Chief Complaint  Patient presents with  . Follow-up   History of Chief Complaint:   HPI Comments: [name] is a 71 y/o female with a past psychiatric history significant for Vascular Dementia. The patient is referred for psychiatric services for psychiatric evaluation and medication management.   The patient reports that her main stressors are: the patient is having a hard time walking and feels unstable of her feet. She reports that she has fallen 5 times in the past month.  She is here with her daughter who is concerned about the patient's safety as she is continuing to fall.  The patient reports that she agrees to feeling sad about "losing her independence."  In the area of affective symptoms, patient appears euthymic. Patient denies current suicidal ideation, intent, or plan. Patient denies current homicidal ideation, intent, or plan. Patient denies auditory hallucinations. Patient denies visual hallucinations. Patient denies symptoms of paranoia. Patient states sleep is poor, with approximately 2-3 hours of sleep per night and has daytime drowsiness during the day. Appetite is fair. Energy level is low. Patient endorses symptoms of anhedonia. Patient denies hopelessness, helplessness, or guilt.   Denies any recent episodes consistent with mania, particularly decreased need for sleep with increased energy, grandiosity, impulsivity, hyperverbal and pressured speech, or increased productivity. Denies any recent symptoms consistent with psychosis, particularly auditory or visual hallucinations, thought broadcasting/insertion/withdrawal, or ideas of reference. Also denies excessive worry to the point of physical symptoms as well as any panic attacks. Denies any history of trauma or symptoms consistent with PTSD such as flashbacks, nightmares, hypervigilance, feelings of numbness  or inability to connect with others.    Review of Systems  Constitutional: Negative.   Respiratory: Negative.   Cardiovascular: Negative.   Gastrointestinal: Negative.    Physical Exam  Vitals reviewed. Constitutional: She appears well-developed and well-nourished. No distress.  Skin: She is not diaphoretic.    Traumatic Brain Injury: No  Past Psychiatric History: Diagnosis: Patient denies  Hospitalizations: Patient denies  Outpatient Care: Patient denies.  Substance Abuse Care: Patient denies  Self-Mutilation: Patient denies  Suicidal Attempts: Patient denies.  Violent Behaviors: Patient denies.   Past Medical History:   Past Medical History  Diagnosis Date  . Atrial fibrillation   . CAD (coronary artery disease)     prior PCI of LAD and Lcx; [place]; Jan 2004  . HLD (hyperlipidemia)   . HTN (hypertension)   . GERD (gastroesophageal reflux disease)     and esophageal ulcerations  . Osteoarthritis   . FHx: migraine headaches   . Fibromyalgia   . TIA (transient ischemic attack)    History of Loss of Consciousness:  No Seizure History:  Yes Cardiac History:  Yes-Arrythmia  Allergies:   Allergies  Allergen Reactions  . Penicillins   . Sulfonamide Derivatives    Current Medications:  Current Outpatient Prescriptions  Medication Sig Dispense Refill  . aspirin 81 MG tablet Take 81 mg by mouth daily.        Marland Kitchen atenolol (TENORMIN) 25 MG tablet TAKE 2 TABLETS EVERY MORNING AND ONE TABLET EVERY EVENING  90 tablet  12  . atorvastatin (LIPITOR) 40 MG tablet Take 40 mg by mouth daily.        . Cyanocobalamin (VITAMIN B 12 PO) Take by mouth every 30 (thirty) days.        Marland Kitchen gabapentin (NEURONTIN) 100 MG capsule 2 tabs po  qd      . Multiple Vitamins-Minerals (THEREMS M) TABS Take by mouth daily.        . nitroGLYCERIN (NITROSTAT) 0.4 MG SL tablet Place 0.4 mg under the tongue every 5 (five) minutes as needed.        Marland Kitchen oxybutynin (DITROPAN-XL) 10 MG 24 hr tablet Take 10 mg  by mouth daily.      . ranitidine (ZANTAC) 150 MG capsule Take 150 mg by mouth 2 (two) times daily.        Marland Kitchen warfarin (COUMADIN) 5 MG tablet Take 5 mg by mouth daily.          Previous Psychotropic Medications:  Medication   None   Substance Abuse History in the last 12 months: Caffeine:Patient denies Tobacco:Patient denies Alcohol:Patient denies Illicit Drugs: Patient denies  Medical Consequences of Substance Abuse: None  Legal Consequences of Substance Abuse: N/A  Family Consequences of Substance Abuse: N/A  Social History: Current Place of Residence: [place], [place] Place of Birth: *[place], [place]. Family Members: Lives with her daughter. Marital Status:  Divorced Children: 2  Sons: 1  Daughters: 1 Relationships: Patient reports her main source of emotional support. 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 Legal History: None Hobbies/Interests: Video games  Family History:   Family History  Problem Relation Age of Onset  . Heart failure Mother   . Heart attack Brother     Mental Status Examination/Evaluation: Objective:  Appearance: Casual  Eye Contact::  Good  Speech:  Clear and Coherent  Volume:  Normal  Mood: "fine"  Affect:  Appropriate, Congruent and Full Range  Thought Process:  Coherent, Linear and Logical  Orientation:  Full  Thought Content:  WDL  Suicidal Thoughts:  No  Homicidal Thoughts:  No  Judgement:  Good  Insight:  Fair  Psychomotor Activity:  Normal  Akathisia:  No  Handed:  Right  Memory: Immediate 4/5; Recent 4/5-per SLUMS exam  Assets:  Communication Skills Desire for Improvement Housing Social Support    Laboratory/X-Ray Psychological Evaluation(s)   None  SLUMS: 29/30   Assessment:    AXIS I  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 Atrial fibrillation   . CAD (coronary artery disease)     prior PCI of LAD and Lcx; [place]; Jan 2004  . HLD (hyperlipidemia)   . HTN (hypertension)   . GERD (gastroesophageal reflux disease)     and esophageal ulcerations  . Osteoarthritis   . FHx: migraine headaches   . Fibromyalgia   . TIA (transient ischemic attack)      AXIS IV other psychosocial or environmental problems  AXIS V 61-70 mild symptoms   Treatment Plan/Recommendations:  PLAN:  1. Affirm with the patient that the medications are taken as ordered. Patient expressed understanding of how their medications were to be used.  2. Continue the following psychiatric medications as written prior to this appointment with the following changes:  a) Patient refuses any medications at this time. B) Will repeat slums exam at next visit. b) Given her current medications, if a medication is required for sleep and depression, will start a trial of mirtazapine, to avoid interactions with her current medications, particularly coumadin. 3. Therapy: brief supportive therapy provided. Continue current services.  4. Risks and benefits, side effects and alternatives discussed with patient, she was given an opportunity to ask questions about his/her medication, illness, and treatment. All current psychiatric medications have been reviewed and discussed with the patient and adjusted as clinically appropriate. The patient has been provided an accurate and updated list of the medications being now prescribed.  5. Patient told to call clinic if any problems occur. Patient advised to go to ER  if she should develop SI/HI, side effects, or if symptoms worsen. Has crisis numbers to call if needed.   6. No labs warranted at this time.  7. The patient was encouraged to keep all PCP and specialty clinic appointments.  Specifically advised patient to  follow up for hypertension. 8. Patient was instructed to return to clinic in 1 month.  9. The patient was advised to call and cancel their mental health appointment within 24 hours of the appointment, if they are unable to keep the appointment, as well as the three no show and termination from clinic policy. 10. The patient expressed understanding of the plan and agrees with the above.   Jacqulyn Cane, MD 10/3/20139:08 AM

## 2012-03-13 ENCOUNTER — Ambulatory Visit (INDEPENDENT_AMBULATORY_CARE_PROVIDER_SITE_OTHER): Payer: Medicare Other | Admitting: Cardiology

## 2012-03-13 ENCOUNTER — Encounter: Payer: Self-pay | Admitting: Cardiology

## 2012-03-13 VITALS — BP 119/78 | HR 89

## 2012-03-13 DIAGNOSIS — I2510 Atherosclerotic heart disease of native coronary artery without angina pectoris: Secondary | ICD-10-CM

## 2012-03-13 DIAGNOSIS — E7800 Pure hypercholesterolemia, unspecified: Secondary | ICD-10-CM

## 2012-03-13 DIAGNOSIS — I4891 Unspecified atrial fibrillation: Secondary | ICD-10-CM

## 2012-03-13 DIAGNOSIS — I10 Essential (primary) hypertension: Secondary | ICD-10-CM

## 2012-03-13 NOTE — Assessment & Plan Note (Signed)
Blood pressure controlled. Continue present medications. 

## 2012-03-13 NOTE — Assessment & Plan Note (Signed)
Not on aspirin given need for Coumadin. Continue statin.  

## 2012-03-13 NOTE — Assessment & Plan Note (Addendum)
Extremely difficult situation. She has had several falls in the past 6 months and bleeding that required intervention from blood vessels around her pancreas by her report. I will obtain all of those records from [facility]. Her risk of anticoagulation is obviously increased. However she is in permanent atrial fibrillation with multiple embolic risk factors including age greater than 52, hypertension, female sex, coronary artery disease, and prior TIA. I therefore feel that she is high risk for an embolic event off of anticoagulation. Continue Coumadin for now. I would prefer for her to be on one of the newer anticoagulants. However her insurance company apparently told her those would not be covered. She will discuss this again and I indicated I would write a letter in support if needed. Continue beta blocker.

## 2012-03-13 NOTE — Assessment & Plan Note (Signed)
Continue statin. 

## 2012-03-13 NOTE — Patient Instructions (Addendum)
Your physician recommends that you schedule a follow-up appointment in: 8 WEEKS WITH DR CRENSHAW IN [place]  

## 2012-03-13 NOTE — Progress Notes (Signed)
HPI: Sharon Garrison is a very pleasant female with coronary artery disease, permanent atrial fibrillation, hypertension. Last Myoview was performed in June of 2011. There was breast attenuation but no scar or ischemia and ejection fraction was 66%. Last echocardiogram in July of 2013 showed normal LV function, moderate left atrial enlargement, mild RAE, mild mitral regurgitation and mild to moderate tricuspid regurgitation. Note previous admission at outside hospital for TIA; also noted to be bradycardic and digoxin DCed and atenolol decreased. F/U holter showed her rate was controlled. Note previous lip swelling with ACE inhibitors. The patient was admitted to [facility] approximately one week ago with bleeding in her abdomen. I do not have those records available but she states it was blood vessels around her pancreas. She required FFP and coiling of a vessel. She was placed on Coumadin 2 days later. She has not had further bleeding. She denies dyspnea, chest pain, palpitations or syncope. Her pedal edema is unchanged. She fell approximately 2 months ago.   Current Outpatient Prescriptions  Medication Sig Dispense Refill  . aspirin 81 MG tablet Take 81 mg by mouth daily.        Marland Kitchen atenolol (TENORMIN) 25 MG tablet Take 25 mg by mouth daily.      Marland Kitchen atorvastatin (LIPITOR) 20 MG tablet Take 20 mg by mouth daily.      Marland Kitchen gabapentin (NEURONTIN) 600 MG tablet Take 600 mg by mouth 3 (three) times daily.      . Multiple Vitamins-Minerals (THEREMS M) TABS Take by mouth daily.        . nitroGLYCERIN (NITROSTAT) 0.4 MG SL tablet Place 0.4 mg under the tongue every 5 (five) minutes as needed.        . ranitidine (ZANTAC) 150 MG capsule Take 150 mg by mouth 2 (two) times daily.        Marland Kitchen warfarin (COUMADIN) 5 MG tablet Take 5 mg by mouth daily.           Past Medical History  Diagnosis Date  . Atrial fibrillation   . CAD (coronary artery disease)     prior PCI of LAD and Lcx; [place]; Jan 2004  . HLD  (hyperlipidemia)   . HTN (hypertension)   . GERD (gastroesophageal reflux disease)     and esophageal ulcerations  . Osteoarthritis   . FHx: migraine headaches   . Fibromyalgia   . TIA (transient ischemic attack)     Past Surgical History  Procedure Date  . Abdominal surgery     for small bowel obstruction  . Prior d&c   . Hysterectomy (other)   . Hernia repair     History   Social History  . Marital Status: Divorced    Spouse Name: N/A    Number of Children: N/A  . Years of Education: N/A   Occupational History  . Not on file.   Social History Main Topics  . Smoking status: Never Smoker   . Smokeless tobacco: Not on file  . Alcohol Use: No  . Drug Use: No  . Sexually Active: Not on file   Other Topics Concern  . Not on file   Social History Narrative  . No narrative on file    ROS: no fevers or chills, productive cough, hemoptysis, dysphasia, odynophagia, melena, hematochezia, dysuria, hematuria, rash, seizure activity, orthopnea, PND, pedal edema, claudication. Remaining systems are negative.  Physical Exam: Well-developed well-nourished in no acute distress.  Skin is warm and dry.  HEENT is normal.  Neck is supple.  Chest is clear to auscultation with normal expansion.  Cardiovascular exam is irregular Abdominal exam nontender or distended. No masses palpated. Extremities show 1+ edema. neuro grossly intact  ECG atrial fibrillation at a rate of 89. Normal axis. Low voltage. Nonspecific ST changes.

## 2012-04-01 ENCOUNTER — Ambulatory Visit (INDEPENDENT_AMBULATORY_CARE_PROVIDER_SITE_OTHER): Payer: Medicare Other | Admitting: Psychiatry

## 2012-04-01 ENCOUNTER — Encounter (HOSPITAL_COMMUNITY): Payer: Self-pay | Admitting: Psychiatry

## 2012-04-01 VITALS — BP 142/88 | HR 74 | Ht 63.0 in | Wt 176.0 lb

## 2012-04-01 DIAGNOSIS — F0630 Mood disorder due to known physiological condition, unspecified: Secondary | ICD-10-CM

## 2012-04-01 NOTE — Progress Notes (Signed)
[facility] Follow-up Outpatient Visit  Sharon Garrison [birth year]  Date: 04/01/2012  HPI Comments: [name] is a 71 y/o female with a past psychiatric history significant for Vascular Dementia. The patient is referred for psychiatric services for psychiatric evaluation and medication management.   She is having some difficulty with transitions related to the change of her health status particularly with not being able to do as much as she could previously do and having to rely on other people for help.  In the area of affective symptoms, patient appears euthymic. Patient denies current suicidal ideation, intent, or plan. Patient denies current homicidal ideation, intent, or plan. Patient denies auditory hallucinations. Patient denies visual hallucinations. Patient denies symptoms of paranoia. Patient states sleep continues to be poor, with approximately 4 hours of sleep per night and has naps up to 4 hours in the afternoon. Appetite is fair. Energy level is low. Patient denies symptoms of anhedonia. Patient denies hopelessness, helplessness, or guilt.   Denies any recent episodes consistent with mania, particularly decreased need for sleep with increased energy, grandiosity, impulsivity, hyperverbal and pressured speech, or increased productivity. Denies any recent symptoms consistent with psychosis, particularly auditory or visual hallucinations, thought broadcasting/insertion/withdrawal, or ideas of reference. Also denies excessive worry to the point of physical symptoms as well as any panic attacks. Denies any history of trauma or symptoms consistent with PTSD such as flashbacks, nightmares, hypervigilance, feelings of numbness or inability to connect with others.   Review of Systems  Constitutional: Negative.  Respiratory: Negative.  Cardiovascular: Negative.  Gastrointestinal: Negative.   Filed Vitals:   04/01/12 1110  Pulse: 74  Height: 5\' 3"  (1.6 m)  Weight: 176 lb  (79.833 kg)   Physical Exam  Vitals reviewed.  Constitutional: She appears well-developed and well-nourished. No distress.  Skin: She is not diaphoretic.   Traumatic Brain Injury: No   Past Psychiatric History: Reviewed Diagnosis: Patient denies   Hospitalizations: Patient denies   Outpatient Care: Patient denies.   Substance Abuse Care: Patient denies   Self-Mutilation: Patient denies   Suicidal Attempts: Patient denies.   Violent Behaviors: Patient denies.    Past Medical History: Reviewed Past Medical History  Diagnosis Date  . Atrial fibrillation   . CAD (coronary artery disease)     prior PCI of LAD and Lcx; [place]; Jan 2004  . HLD (hyperlipidemia)   . HTN (hypertension)   . GERD (gastroesophageal reflux disease)     and esophageal ulcerations  . Osteoarthritis   . FHx: migraine headaches   . Fibromyalgia   . TIA (transient ischemic attack)    History of Loss of Consciousness: No  Seizure History: Yes  Cardiac History: Yes-Arrythmia   Allergies: Reviewed Allergies  Allergen Reactions  . Lisinopril Swelling  . Sulfonamide Derivatives      Current Medications: Reviewed Current Outpatient Prescriptions on File Prior to Visit  Medication Sig Dispense Refill  . atenolol (TENORMIN) 25 MG tablet Take 25 mg by mouth daily.      Marland Kitchen atorvastatin (LIPITOR) 20 MG tablet Take 20 mg by mouth daily.      Marland Kitchen gabapentin (NEURONTIN) 600 MG tablet Take 600 mg by mouth 3 (three) times daily.      . Multiple Vitamins-Minerals (THEREMS M) TABS Take by mouth daily.        . nitroGLYCERIN (NITROSTAT) 0.4 MG SL tablet Place 0.4 mg under the tongue every 5 (five) minutes as needed.        . ranitidine (ZANTAC)  150 MG capsule Take 150 mg by mouth 2 (two) times daily.        Marland Kitchen warfarin (COUMADIN) 5 MG tablet Take 5 mg by mouth daily.         Previous Psychotropic Medications:  Medication   None     Substance Abuse History in the last 12 months:   Caffeine:Patient denies    Tobacco:Patient denies  Alcohol:Patient denies  Illicit Drugs: Patient denies   Medical Consequences of Substance Abuse: None  Legal Consequences of Substance Abuse: N/A  Family Consequences of Substance Abuse: N/A   Social History: Reviewed Current Place of Residence: [place], [place]  Place of Birth: *Sharon Garrison.  Family Members: Lives with her daughter.  Marital Status: Divorced  Children: 2  Sons: 1  Daughters: 1  Relationships: Patient reports her main source of emotional support.  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  Legal History: None  Hobbies/Interests: Video games   Family History: Reviewed Family History  Problem Relation Age of Onset  . Heart failure Mother   . Heart attack Brother      Mental Status Examination/Evaluation:  Objective: Appearance: Casual   Eye Contact:: Good   Speech: Clear and Coherent   Volume: Normal   Mood: "good"   Affect: Appropriate, Congruent and Full Range   Thought Process: Coherent, Linear and Logical   Orientation: Full   Thought Content: WDL   Suicidal Thoughts: No   Homicidal Thoughts: No   Judgement: Good   Insight: Fair   Psychomotor Activity: Normal   Akathisia: No   Handed: Right   Memory: Immediate 2/5; Recent 2/5-per SLUMS exam   Assets: Communication Skills  Desire for Improvement  Housing  Social Support    Laboratory/X-Ray  Psychological Evaluation(s)   None  SLUMS: 17/30  Assessment:  AXIS I  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  Atrial fibrillation     .  CAD (coronary artery disease)       prior PCI of LAD and Lcx; [place]; Jan 2004    .  HLD (hyperlipidemia)      .  HTN (hypertension)     .  GERD (gastroesophageal reflux disease)       and esophageal ulcerations    .  Osteoarthritis     .  FHx: migraine headaches     .  Fibromyalgia     .  TIA (transient ischemic attack)       AXIS IV  other psychosocial or environmental problems   AXIS V  61-70 mild symptoms    Treatment Plan/Recommendations:  PLAN:  1. Affirm with the patient that the medications are taken as ordered. Patient expressed understanding of how their medications were to be used.  2. Continue the following psychiatric medications as written prior to this appointment with the following changes:  a) Patient continues to refuse any medications at this time. She was offered the option of galantamine or aricept but refused at this time. b) Given her current medications, if a medication is required for sleep and depression, will start a trial of mirtazapine, to avoid interactions with her current medications, particularly coumadin.  3. Therapy:  brief supportive therapy provided. Continue current services.  4. Risks and benefits, side effects and alternatives discussed with patient, she was given an opportunity to ask questions about his/her medication, illness, and treatment. All current psychiatric medications have been reviewed and discussed with the patient and adjusted as clinically appropriate. The patient has been provided an accurate and updated list of the medications being now prescribed.  5. Patient told to call clinic if any problems occur. Patient advised to go to ER if she should develop SI/HI, side effects, or if symptoms worsen. Has crisis numbers to call if needed.  6. No labs warranted at this time.  7. The patient was encouraged to keep all PCP and specialty clinic appointments. Specifically advised patient to follow up for hypertension.  8. Patient was instructed to return to clinic in 1 month.  9. The patient was advised to call and cancel their mental health appointment  within 24 hours of the appointment, if they are unable to keep the appointment, as well as the three no show and termination from clinic policy.  10. The patient expressed understanding of the plan and agrees with the above.  Jacqulyn Cane, MD

## 2012-04-10 ENCOUNTER — Encounter (HOSPITAL_COMMUNITY): Payer: Self-pay

## 2012-04-10 NOTE — Telephone Encounter (Signed)
This encounter was created in error - please disregard.

## 2012-05-02 ENCOUNTER — Telehealth: Payer: Self-pay | Admitting: Cardiology

## 2012-05-02 NOTE — Telephone Encounter (Signed)
Left message for pt to call.

## 2012-05-02 NOTE — Telephone Encounter (Signed)
New Problem:    Patient called in wanting to speak with you about an appointment she had today.  Please call back.

## 2012-05-03 NOTE — Telephone Encounter (Signed)
Spoke with pt, the last time she was in the [facility] they wanted her to see a dr Onalee Hua smull at [facility]. She saw him yesterday and he thinks she needs a right heart cath for her pulmonary hypertension. She wants dr Jens Som to be aware and talk to dr Claiborne Billings [phone]. She has a follow up appt with dr Jens Som wed next week and will discuss with him at that time. Will forward for dr Jens Som review

## 2012-05-08 ENCOUNTER — Encounter: Payer: Self-pay | Admitting: Cardiology

## 2012-05-08 ENCOUNTER — Ambulatory Visit (INDEPENDENT_AMBULATORY_CARE_PROVIDER_SITE_OTHER): Payer: Medicare Other | Admitting: Cardiology

## 2012-05-08 VITALS — BP 148/87 | HR 72

## 2012-05-08 DIAGNOSIS — I2789 Other specified pulmonary heart diseases: Secondary | ICD-10-CM

## 2012-05-08 DIAGNOSIS — I4891 Unspecified atrial fibrillation: Secondary | ICD-10-CM

## 2012-05-08 DIAGNOSIS — I272 Pulmonary hypertension, unspecified: Secondary | ICD-10-CM | POA: Insufficient documentation

## 2012-05-08 DIAGNOSIS — I2510 Atherosclerotic heart disease of native coronary artery without angina pectoris: Secondary | ICD-10-CM

## 2012-05-08 MED ORDER — FUROSEMIDE 20 MG PO TABS: 20.0000 mg | ORAL_TABLET | Freq: Every day | ORAL | Status: DC

## 2012-05-08 NOTE — Patient Instructions (Addendum)
Your physician recommends that you schedule a follow-up appointment in: 3-4 WEEKS WITH DR Jens Som IN [place]  Your physician has requested that you have a lexiscan myoview. For further information please visit [email or web address] Please follow instruction sheet, as given.   START FUROSEMIDE 20 MG ONCE DAILY  Your physician recommends that you return for lab work in: ONE WEEK

## 2012-05-08 NOTE — Assessment & Plan Note (Signed)
Continue statin. 

## 2012-05-08 NOTE — Assessment & Plan Note (Signed)
Continue statin. Not on aspirin given need for Coumadin. Given recent chest pain prior to being admitted I will plan a Myoview to screen for ischemia.

## 2012-05-08 NOTE — Assessment & Plan Note (Signed)
Blood pressure mildly elevated. I am adding low-dose Lasix for mild volume excess which should also help with her blood pressure.

## 2012-05-08 NOTE — Assessment & Plan Note (Signed)
Patient is being evaluated by a pulmonologist for her pulmonary hypertension. Rheumatologic labs are pending. Previous CT showed no pulmonary embolus. I am not sure whether she has had pulmonary function tests. She certainly could have a component of pulmonary venous hypertension. Given recent admission with pulmonary edema and chronic lower extremity edema I will add Lasix 20 mg daily. Check potassium and renal function in one week. She is scheduled to see her pulmonologist in one week. Once she has been seen I will discuss further with him the need for right and left catheterization.

## 2012-05-08 NOTE — Progress Notes (Signed)
HPI: Sharon Garrison is a very pleasant female with coronary artery disease, permanent atrial fibrillation, hypertension. Last Myoview was performed in June of 2011. There was breast attenuation but no scar or ischemia and ejection fraction was 66%. Last echocardiogram in July of 2013 showed normal LV function, moderate left atrial enlargement, mild RAE, mild mitral regurgitation and mild to moderate tricuspid regurgitation. Note previous admission at outside hospital for TIA; also noted to be bradycardic and digoxin DCed and atenolol decreased. F/U holter showed her rate was controlled. Note previous lip swelling with ACE inhibitors. Also with history of mesenteric hematoma requiring embolization. Patient admitted in [place] recently with dyspnea. VQ scan was low probability. CT scan showed patchy mild ground glass infiltrates involving the right upper and right lower lobe. Pro BNP 3563. Enzymes negative. Echocardiogram repeated and showed normal LV function, mild mitral regurgitation, moderate tricuspid regurgitation and moderate pulmonary hypertension. She was treated with antibiotics and Lasix. Seen by pulmonary and rheumatologic workup initiated.There was also question of need for R and L cath. She has some dyspnea on exertion but no orthopnea or PND. She has chronic pedal edema. She did have chest pain on the day she was admitted but has had no chest pain since. Her pain was described as a pushing sensation without radiation. It was not pleuritic. It lasted 2 hours.   Current Outpatient Prescriptions  Medication Sig Dispense Refill  . atenolol (TENORMIN) 25 MG tablet Take 25 mg by mouth 2 (two) times daily.       Marland Kitchen atorvastatin (LIPITOR) 20 MG tablet Take 20 mg by mouth daily.      Marland Kitchen gabapentin (NEURONTIN) 600 MG tablet Take 600 mg by mouth 3 (three) times daily.      . Multiple Vitamins-Minerals (THEREMS M) TABS Take by mouth daily.        . nitroGLYCERIN (NITROSTAT) 0.4 MG SL tablet Place 0.4  mg under the tongue every 5 (five) minutes as needed.        . ranitidine (ZANTAC) 150 MG capsule Take 150 mg by mouth 2 (two) times daily.        . verapamil (CALAN) 80 MG tablet Take 80 mg by mouth 2 (two) times daily.      Marland Kitchen warfarin (COUMADIN) 5 MG tablet Take 5 mg by mouth daily.           Past Medical History  Diagnosis Date  . Atrial fibrillation   . CAD (coronary artery disease)     prior PCI of LAD and Lcx; [place]; Jan 2004  . HLD (hyperlipidemia)   . HTN (hypertension)   . GERD (gastroesophageal reflux disease)     and esophageal ulcerations  . Osteoarthritis   . FHx: migraine headaches   . Fibromyalgia   . TIA (transient ischemic attack)     Past Surgical History  Procedure Date  . Abdominal surgery     for small bowel obstruction  . Prior d&c   . Hysterectomy (other)   . Hernia repair     History   Social History  . Marital Status: Divorced    Spouse Name: N/A    Number of Children: N/A  . Years of Education: N/A   Occupational History  . Not on file.   Social History Main Topics  . Smoking status: Never Smoker   . Smokeless tobacco: Not on file  . Alcohol Use: No  . Drug Use: No  . Sexually Active: No   Other Topics Concern  .  Not on file   Social History Narrative  . No narrative on file    ROS: no fevers or chills, productive cough, hemoptysis, dysphasia, odynophagia, melena, hematochezia, dysuria, hematuria, rash, seizure activity, orthopnea, PND, pedal edema, claudication. Remaining systems are negative.  Physical Exam: Well-developed well nourished in no acute distress.  Skin is warm and dry.  HEENT is normal.  Neck is supple.  Chest is clear to auscultation with normal expansion.  Cardiovascular exam is irregular Abdominal exam nontender or distended. No masses palpated. Extremities show 1+ edema. neuro grossly intact

## 2012-05-08 NOTE — Assessment & Plan Note (Signed)
Continue present medications for rate control and continue Coumadin.

## 2012-05-13 ENCOUNTER — Ambulatory Visit (HOSPITAL_COMMUNITY): Payer: Self-pay | Admitting: Psychiatry

## 2012-05-16 ENCOUNTER — Encounter (HOSPITAL_COMMUNITY): Payer: Self-pay

## 2012-05-16 ENCOUNTER — Other Ambulatory Visit: Payer: Self-pay

## 2012-05-28 ENCOUNTER — Ambulatory Visit (HOSPITAL_COMMUNITY): Payer: Medicare Other | Attending: Cardiovascular Disease | Admitting: Radiology

## 2012-05-28 ENCOUNTER — Ambulatory Visit (INDEPENDENT_AMBULATORY_CARE_PROVIDER_SITE_OTHER): Payer: Medicare Other | Admitting: *Deleted

## 2012-05-28 VITALS — BP 121/81 | HR 52 | Ht 63.0 in | Wt 167.0 lb

## 2012-05-28 DIAGNOSIS — I2510 Atherosclerotic heart disease of native coronary artery without angina pectoris: Secondary | ICD-10-CM

## 2012-05-28 DIAGNOSIS — I10 Essential (primary) hypertension: Secondary | ICD-10-CM | POA: Insufficient documentation

## 2012-05-28 DIAGNOSIS — R079 Chest pain, unspecified: Secondary | ICD-10-CM

## 2012-05-28 DIAGNOSIS — R0609 Other forms of dyspnea: Secondary | ICD-10-CM | POA: Insufficient documentation

## 2012-05-28 DIAGNOSIS — R0602 Shortness of breath: Secondary | ICD-10-CM

## 2012-05-28 DIAGNOSIS — I4891 Unspecified atrial fibrillation: Secondary | ICD-10-CM

## 2012-05-28 DIAGNOSIS — R0789 Other chest pain: Secondary | ICD-10-CM | POA: Insufficient documentation

## 2012-05-28 DIAGNOSIS — R0989 Other specified symptoms and signs involving the circulatory and respiratory systems: Secondary | ICD-10-CM | POA: Insufficient documentation

## 2012-05-28 LAB — BASIC METABOLIC PANEL
CO2: 31 mEq/L (ref 19–32)
Calcium: 9.1 mg/dL (ref 8.4–10.5)
Chloride: 101 mEq/L (ref 96–112)
Sodium: 139 mEq/L (ref 135–145)

## 2012-05-28 MED ORDER — TECHNETIUM TC 99M SESTAMIBI GENERIC - CARDIOLITE
30.0000 | Freq: Once | INTRAVENOUS | Status: AC | PRN
  Administered 2012-05-28: 30 via INTRAVENOUS

## 2012-05-28 MED ORDER — REGADENOSON 0.4 MG/5ML IV SOLN
0.4000 mg | Freq: Once | INTRAVENOUS | Status: AC
  Administered 2012-05-28: 0.4 mg via INTRAVENOUS

## 2012-05-28 MED ORDER — TECHNETIUM TC 99M SESTAMIBI GENERIC - CARDIOLITE
10.0000 | Freq: Once | INTRAVENOUS | Status: AC | PRN
  Administered 2012-05-28: 10 via INTRAVENOUS

## 2012-05-28 NOTE — Progress Notes (Signed)
[facility] SITE 3 NUCLEAR MED [address] [place], [place] [address] [phone]    Cardiology Nuclear Med Study  Sharon Garrison is a 71 y.o. female     MRN : [id number]     DOB: [birth year]  Procedure Date: 05/28/2012  Nuclear Med Background Indication for Stress Test:  Evaluation for Ischemia History:  '04 MI>PTCA/Stent-LAD/LCFX; '11 MPS:No ischemia, EF=66%; 7/13 Echo:EF=60%, moderate pulmonary HTN; 12/13 Hospital with dyspnea/CP, negative enzymes Cardiac Risk Factors: Hypertension and Lipids  Symptoms:  Chest Pressure.  (last episode of chest discomfort:none since discharge) and DOE   Nuclear Pre-Procedure Caffeine/Decaff Intake:  None NPO After: 7:00pm   Lungs:  Clear. O2 Sat: 94% on room air. IV 0.9% NS with Angio Cath:  22g  IV Site: R Wrist  IV Started by:  Doyne Keel, CNMT  Chest Size (in):  42 Cup Size: DDD  Height: 5\' 3"  (1.6 m)  Weight:  167 lb (75.751 kg)  BMI:  Body mass index is 29.58 kg/(m^2). Tech Comments:  n/a    Nuclear Med Study 1 or 2 day study: 1 day  Stress Test Type:  Lexiscan  Reading MD: Kristeen Miss, MD  Order Authorizing Provider:  Ripley Fraise  Resting Radionuclide: Technetium 45m Sestamibi  Resting Radionuclide Dose: 11.0 mCi   Stress Radionuclide:  Technetium 44m Sestamibi  Stress Radionuclide Dose: 33.0 mCi           Stress Protocol Rest HR: 52 Stress HR: 59  Rest BP: 121/81 Stress BP: 114/80  Exercise Time (min): n/a METS: n/a   Predicted Max HR: 147 bpm % Max HR: 40.14 bpm Rate Pressure Product: 6726    Dose of Adenosine (mg):  n/a Dose of Lexiscan: 0.4 mg  Dose of Atropine (mg): n/a Dose of Dobutamine: n/a mcg/kg/min (at max HR)  Stress Test Technologist: Smiley Houseman, CMA-N  Nuclear Technologist:  Doyne Keel, CNMT     Rest Procedure:  Myocardial perfusion imaging was performed at rest 45 minutes following the intravenous administration of Technetium 77m Sestamibi.  Rest ECG: Atrial  Fibrilliation  Stress Procedure:  The patient received IV Lexiscan 0.4 mg over 15-seconds.  Technetium 51m Sestamibi injected at 30-seconds.  Quantitative spect images were obtained after a 45 minute delay.  Six minutes recovery BP 92/63, patient asymptomatic; rechecked after stress images and was 108/70l  Stress ECG: No significant change from baseline ECG  QPS Raw Data Images:  Normal; no motion artifact; normal heart/lung ratio. Stress Images:  There is mild apical thinning with normal uptake in other regions. Rest Images:  There is mild apical thinning with normal uptake in other regions. Subtraction (SDS):  No evidence of ischemia. Transient Ischemic Dilatation (Normal <1.22):  0.90 Lung/Heart Ratio (Normal <0.45):  0.31  Quantitative Gated Spect Images QGS EDV:  87 ml QGS ESV:  27 ml  Impression Exercise Capacity:  Lexiscan with no exercise. BP Response:  Normal blood pressure response. Clinical Symptoms:  No significant symptoms noted. ECG Impression:  No significant ST segment change suggestive of ischemia. Comparison with Prior Nuclear Study: No images to compare  Overall Impression:  Normal stress nuclear study.  No evidence of ischemia.   LV Ejection Fraction: 69%.  LV Wall Motion:  NL LV Function; NL Wall Motion.   Vesta Mixer, Montez Hageman., MD, [facility] 05/28/2012, 3:46 PM Office - [phone] Pager [phone]

## 2012-06-03 ENCOUNTER — Ambulatory Visit (INDEPENDENT_AMBULATORY_CARE_PROVIDER_SITE_OTHER): Payer: Medicare Other | Admitting: Psychiatry

## 2012-06-03 ENCOUNTER — Encounter (HOSPITAL_COMMUNITY): Payer: Self-pay | Admitting: Psychiatry

## 2012-06-03 VITALS — BP 151/77 | HR 57 | Ht 63.0 in | Wt 177.0 lb

## 2012-06-03 DIAGNOSIS — F0630 Mood disorder due to known physiological condition, unspecified: Secondary | ICD-10-CM

## 2012-06-03 NOTE — Progress Notes (Signed)
[facility] Follow-up Outpatient Visit  Sharon Garrison 1940-09-24  Date: 06/03/2012  HPI Comments: [name] is a 72 y/o female with a past psychiatric history significant for Vascular Dementia. The patient is referred for psychiatric services for psychiatric evaluation and medication management.   The patient is here with her daughter who provides collateral information. The patient believes she is doing well but cannot remember what she ate for dinner or lunch yesterday.  Her daughter reports that the patient may hearing issues but has refused to schedule a hearing test. The patient has difficult with hearing, particularly in church and if a person is a significant distance away from her.  In the area of affective symptoms, patient appears euthymic. Patient denies current suicidal ideation, intent, or plan. Patient denies current homicidal ideation, intent, or plan. Patient denies auditory hallucinations. Patient denies visual hallucinations. Patient denies symptoms of paranoia. Patient states sleep continues to be poor, with approximately 4 hours of sleep per night and has naps up to 4 hours in the afternoon. Appetite is . Energy level is low. Patient denies symptoms of anhedonia. Patient denies hopelessness, helplessness, or guilt.   Denies any recent episodes consistent with mania, particularly decreased need for sleep with increased energy, grandiosity, impulsivity, hyperverbal and pressured speech, or increased productivity. Denies any recent symptoms consistent with psychosis, particularly auditory or visual hallucinations, thought broadcasting/insertion/withdrawal, or ideas of reference. Also denies excessive worry to the point of physical symptoms as well as any panic attacks. Denies any history of trauma or symptoms consistent with PTSD such as flashbacks, nightmares, hypervigilance, feelings of numbness or inability to connect with others.   Review of Systems    Constitutional: Negative.  Respiratory: Negative.  Cardiovascular: Negative.  Gastrointestinal: Negative.   Filed Vitals:   06/03/12 1136  BP: 151/77  Pulse: 57  Height: 5\' 3"  (1.6 m)  Weight: 177 lb (80.287 kg)   Physical Exam  Vitals reviewed.  Constitutional: She appears well-developed and well-nourished. No distress.  Skin: She is not diaphoretic.   Traumatic Brain Injury: No   Past Psychiatric History: Reviewed Diagnosis: Patient denies   Hospitalizations: Patient denies   Outpatient Care: Patient denies.   Substance Abuse Care: Patient denies   Self-Mutilation: Patient denies   Suicidal Attempts: Patient denies.   Violent Behaviors: Patient denies.    Past Medical History: Reviewed Past Medical History  Diagnosis Date  . Atrial fibrillation   . CAD (coronary artery disease)     prior PCI of LAD and Lcx; [place]; Jan 2004  . HLD (hyperlipidemia)   . HTN (hypertension)   . GERD (gastroesophageal reflux disease)     and esophageal ulcerations  . Osteoarthritis   . FHx: migraine headaches   . Fibromyalgia   . TIA (transient ischemic attack)    History of Loss of Consciousness: No  Seizure History: Yes  Cardiac History: Yes-Arrythmia   Allergies: Reviewed Allergies  Allergen Reactions  . Lisinopril Swelling  . Sulfonamide Derivatives      Current Medications: Reviewed Current Outpatient Prescriptions on File Prior to Visit  Medication Sig Dispense Refill  . atenolol (TENORMIN) 25 MG tablet Take 25 mg by mouth 2 (two) times daily.       Marland Kitchen atorvastatin (LIPITOR) 20 MG tablet Take 20 mg by mouth daily.      . furosemide (LASIX) 20 MG tablet Take 1 tablet (20 mg total) by mouth daily.  90 tablet  3  . gabapentin (NEURONTIN) 600 MG tablet Take 600  mg by mouth 3 (three) times daily.      . Multiple Vitamins-Minerals (THEREMS M) TABS Take by mouth daily.        . nitroGLYCERIN (NITROSTAT) 0.4 MG SL tablet Place 0.4 mg under the tongue every 5 (five) minutes  as needed.        . ranitidine (ZANTAC) 150 MG capsule Take 150 mg by mouth 2 (two) times daily.        . verapamil (CALAN) 80 MG tablet Take 80 mg by mouth 2 (two) times daily.      Marland Kitchen warfarin (COUMADIN) 5 MG tablet Take 5 mg by mouth daily.         Previous Psychotropic Medications:  Medication   None     Substance Abuse History in the last 12 months:   Caffeine:Patient denies  Tobacco:Patient denies  Alcohol:Patient denies  Illicit Drugs: Patient denies   Medical Consequences of Substance Abuse: None  Legal Consequences of Substance Abuse: N/A  Family Consequences of Substance Abuse: N/A   Social History: Reviewed Current Place of Residence: [place], [place]  Place of Birth: *[place], [place].  Family Members: Lives with her daughter.  Marital Status: Divorced  Children: 2  Sons: 1  Daughters: 1  Relationships: Patient reports her main source of emotional support.  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  Legal History: None  Hobbies/Interests: Video games   Family History: Reviewed Family History  Problem Relation Age of Onset  . Heart failure Mother   . Heart attack Brother      Mental Status Examination/Evaluation:  Objective: Appearance: Casual   Eye Contact:: Good   Speech: Clear and Coherent   Volume: Normal   Mood: "good" 5-6/10  Affect: Appropriate, Congruent and Full Range   Thought Process: Coherent, Linear and Logical   Orientation: Full   Thought Content: WDL   Suicidal Thoughts: No   Homicidal Thoughts: No   Judgement: Good   Insight: Fair   Psychomotor Activity: Normal   Akathisia: No   Handed: Right   Memory: Immediate 5/5; Recent 4/5-per SLUMS exam   Assets: Communication Skills  Desire for Improvement  Housing  Social Support     Laboratory/X-Ray  Psychological Evaluation(s)   None  SLUMS: 22/30  Assessment:  AXIS I  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  Atrial fibrillation     .  CAD (coronary artery disease)       prior PCI of LAD and Lcx; [place]; Jan 2004    .  HLD (hyperlipidemia)     .  HTN (hypertension)     .  GERD (gastroesophageal reflux disease)       and esophageal ulcerations    .  Osteoarthritis     .  FHx: migraine headaches     .  Fibromyalgia     .  TIA (transient ischemic attack)     Sleep Apnea  AXIS IV  other psychosocial or environmental problems   AXIS V  61-70 mild symptoms    Treatment Plan/Recommendations:  PLAN:  1. No medications prescribed by this office. 2. Continue the following psychiatric medications as written prior to this appointment with the following changes:  a) She is awaiting a sleep apnea machine, will hold off ariicept and galantamine until hearing and sleep apnea has been addressed. B) Patient did not want an antidepressant and has not taken mirtazapine. 3. Therapy: brief supportive therapy provided. Continue current services.  4. Risks and benefits, side effects and alternatives discussed with patient, she was given an opportunity to ask questions about her medication, illness, and treatment. All current psychiatric medications have been reviewed and discussed with the patient and adjusted as clinically appropriate. The patient has been provided an accurate and updated list of the medications being now prescribed.  5. Patient told to call clinic if any problems occur. Patient advised to go to ER if she should develop SI/HI, side effects, or if symptoms worsen. Has crisis numbers to call if needed.  6. No labs warranted at this time.  7. The patient was encouraged to keep all PCP and specialty clinic appointments. Specifically advised patient to follow up for  hypertension.  8. Patient was instructed to return to clinic in 2 months.  9. The patient was advised to call and cancel their mental health appointment within 24 hours of the appointment, if they are unable to keep the appointment, as well as the three no show and termination from clinic policy.  10. The patient expressed understanding of the plan and agrees with the above.  Jacqulyn Cane, MD

## 2012-06-12 ENCOUNTER — Ambulatory Visit (INDEPENDENT_AMBULATORY_CARE_PROVIDER_SITE_OTHER): Payer: Medicare Other | Admitting: Cardiology

## 2012-06-12 ENCOUNTER — Encounter: Payer: Self-pay | Admitting: Cardiology

## 2012-06-12 VITALS — BP 128/70 | HR 68

## 2012-06-12 DIAGNOSIS — I10 Essential (primary) hypertension: Secondary | ICD-10-CM

## 2012-06-12 DIAGNOSIS — I2789 Other specified pulmonary heart diseases: Secondary | ICD-10-CM

## 2012-06-12 DIAGNOSIS — E7800 Pure hypercholesterolemia, unspecified: Secondary | ICD-10-CM

## 2012-06-12 DIAGNOSIS — I272 Pulmonary hypertension, unspecified: Secondary | ICD-10-CM

## 2012-06-12 DIAGNOSIS — I2510 Atherosclerotic heart disease of native coronary artery without angina pectoris: Secondary | ICD-10-CM

## 2012-06-12 DIAGNOSIS — I4891 Unspecified atrial fibrillation: Secondary | ICD-10-CM

## 2012-06-12 NOTE — Assessment & Plan Note (Signed)
Continue statin. Lipids and liver monitored by primary care. 

## 2012-06-12 NOTE — Progress Notes (Signed)
HPI: Sharon Garrison is a very pleasant female with coronary artery disease, permanent atrial fibrillation, hypertension for fu. Note previous admission at outside hospital for TIA; also noted to be bradycardic and digoxin DCed and atenolol decreased. F/U holter showed her rate was controlled. Note previous lip swelling with ACE inhibitors. Also with history of mesenteric hematoma requiring embolization. Patient admitted in [place] recently with dyspnea. VQ scan was low probability. CT scan showed patchy mild ground glass infiltrates involving the right upper and right lower lobe. Pro BNP 3563. Enzymes negative. Echocardiogram repeated and showed normal LV function, mild mitral regurgitation, moderate tricuspid regurgitation and moderate pulmonary hypertension. She was treated with antibiotics and Lasix. Seen by pulmonary and rheumatologic workup initiated. Myoview in December of 2013 showed an ejection fraction of 69% and normal perfusion. When I last saw her there was discussion from her pulmonologist concerning possible need for right heart catheterization. I also added lasix 20 mg po daily. Since that time, she has some dyspnea on exertion but improved. No orthopnea or PND. Her pedal edema is markedly improved with a combination of Lasix and compression hose. No chest pain, palpitations or syncope.  Current Outpatient Prescriptions  Medication Sig Dispense Refill  . atenolol (TENORMIN) 25 MG tablet 2 tabs am and 1 tab pm      . atorvastatin (LIPITOR) 20 MG tablet Take 20 mg by mouth daily.      . furosemide (LASIX) 20 MG tablet Take 1 tablet (20 mg total) by mouth daily.  90 tablet  3  . gabapentin (NEURONTIN) 600 MG tablet Take 600 mg by mouth 3 (three) times daily.      . Multiple Vitamins-Minerals (THEREMS M) TABS Take by mouth daily.        . nitroGLYCERIN (NITROSTAT) 0.4 MG SL tablet Place 0.4 mg under the tongue every 5 (five) minutes as needed.        . NON FORMULARY cpap machin qhs        . ranitidine (ZANTAC) 150 MG capsule Take 150 mg by mouth 2 (two) times daily.        . verapamil (CALAN) 80 MG tablet Take 80 mg by mouth 2 (two) times daily.      Marland Kitchen warfarin (COUMADIN) 5 MG tablet Take 5 mg by mouth daily.           Past Medical History  Diagnosis Date  . Atrial fibrillation   . CAD (coronary artery disease)     prior PCI of LAD and Lcx; [place]; Jan 2004  . HLD (hyperlipidemia)   . HTN (hypertension)   . GERD (gastroesophageal reflux disease)     and esophageal ulcerations  . Osteoarthritis   . FHx: migraine headaches   . Fibromyalgia   . TIA (transient ischemic attack)     Past Surgical History  Procedure Date  . Abdominal surgery     for small bowel obstruction  . Prior d&c   . Hysterectomy (other)   . Hernia repair     History   Social History  . Marital Status: Divorced    Spouse Name: N/A    Number of Children: N/A  . Years of Education: N/A   Occupational History  . Not on file.   Social History Main Topics  . Smoking status: Never Smoker   . Smokeless tobacco: Not on file  . Alcohol Use: No  . Drug Use: No  . Sexually Active: No   Other Topics Concern  . Not on  file   Social History Narrative  . No narrative on file    ROS: no fevers or chills, productive cough, hemoptysis, dysphasia, odynophagia, melena, hematochezia, dysuria, hematuria, rash, seizure activity, orthopnea, PND, pedal edema, claudication. Remaining systems are negative.  Physical Exam: Well-developed well-nourished in no acute distress.  Skin is warm and dry.  HEENT is normal.  Neck is supple.  Chest is clear to auscultation with normal expansion.  Cardiovascular exam is irregular Abdominal exam nontender or distended. No masses palpated. Extremities show 1+ edema. neuro grossly intact

## 2012-06-12 NOTE — Assessment & Plan Note (Signed)
Continue statin. Not on aspirin given need for Coumadin. 

## 2012-06-12 NOTE — Assessment & Plan Note (Signed)
Continue beta blocker and calcium blocker for rate control. Continue Coumadin.

## 2012-06-12 NOTE — Assessment & Plan Note (Addendum)
Most likely from a combination of pulmonary venous hypertension and sleep apnea. Continue present dose of Lasix. She is scheduled to start CPAP this evening. She is now being followed by pulmonary in [place].

## 2012-06-12 NOTE — Patient Instructions (Addendum)
Your physician recommends that you schedule a follow-up appointment in: 3 MONTHS WITH DR CRENSHAW IN [place]  

## 2012-06-12 NOTE — Assessment & Plan Note (Signed)
Blood pressure controlled. Continue present medications. 

## 2012-08-01 ENCOUNTER — Encounter (HOSPITAL_COMMUNITY): Payer: Self-pay | Admitting: Psychiatry

## 2012-08-01 ENCOUNTER — Ambulatory Visit (INDEPENDENT_AMBULATORY_CARE_PROVIDER_SITE_OTHER): Payer: Medicare Other | Admitting: Psychiatry

## 2012-08-01 VITALS — BP 152/109 | HR 66 | Ht 63.0 in | Wt 182.0 lb

## 2012-08-01 DIAGNOSIS — F0630 Mood disorder due to known physiological condition, unspecified: Secondary | ICD-10-CM

## 2012-08-01 NOTE — Progress Notes (Signed)
[facility] Follow-up Outpatient Visit  Sharon Garrison [birth year]  Date: 08/01/2012  HPI Comments: [name] is a 72 y/o female with a past psychiatric history significant for Vascular Dementia. The patient is referred for psychiatric services for psychiatric evaluation and medication management.   The patient is here with her daughter who provides collateral information. The patient reports that she went for a hearing evaluation and is awaiting the result. She is still not interested in started medications, including Aricept or Namenda. She denies any other complaints.  Her daughter denies any concern other than some irritability and some problems with memory.  In the area of affective symptoms, patient appears euthymic. Patient denies current suicidal ideation, intent, or plan. Patient denies current homicidal ideation, intent, or plan. Patient denies auditory hallucinations. Patient denies visual hallucinations. Patient denies symptoms of paranoia. Patient states sleep is slightly improved with the use of a CPAP with about 5 hours of sleep. Appetite is . Energy level is low. Patient denies symptoms of anhedonia. Patient denies hopelessness, helplessness, or guilt.   Denies any recent episodes consistent with mania, particularly decreased need for sleep with increased energy, grandiosity, impulsivity, hyperverbal and pressured speech, or increased productivity. Denies any recent symptoms consistent with psychosis, particularly auditory or visual hallucinations, thought broadcasting/insertion/withdrawal, or ideas of reference. Also denies excessive worry to the point of physical symptoms as well as any panic attacks. Denies any history of trauma or symptoms consistent with PTSD such as flashbacks, nightmares, hypervigilance, feelings of numbness or inability to connect with others.   Review of Systems  Constitutional: Negative for fever, chills and weight loss.  Cardiovascular:  Positive for leg swelling (Improving with compression stockings.). Negative for chest pain and palpitations.  Gastrointestinal: Negative for nausea, vomiting, diarrhea and constipation.     Filed Vitals:   08/01/12 1140  BP: 152/109  Pulse: 66  Height: 5\' 3"  (1.6 m)  Weight: 182 lb (82.555 kg)   Physical Exam  Vitals reviewed.  Constitutional: She appears well-developed and well-nourished. No distress.  Skin: She is not diaphoretic.   Traumatic Brain Injury: No   Past Psychiatric History: Reviewed Diagnosis: Patient denies   Hospitalizations: Patient denies   Outpatient Care: Patient denies.   Substance Abuse Care: Patient denies   Self-Mutilation: Patient denies   Suicidal Attempts: Patient denies.   Violent Behaviors: Patient denies.    Past Medical History: Reviewed Past Medical History  Diagnosis Date  . Atrial fibrillation   . CAD (coronary artery disease)     prior PCI of LAD and Lcx; [place]; Jan 2004  . HLD (hyperlipidemia)   . HTN (hypertension)   . GERD (gastroesophageal reflux disease)     and esophageal ulcerations  . Osteoarthritis   . FHx: migraine headaches   . Fibromyalgia   . TIA (transient ischemic attack)   . Gall bladder stones    History of Loss of Consciousness: No  Seizure History: Yes  Cardiac History: Yes-Arrythmia   Allergies: Reviewed Allergies  Allergen Reactions  . Lisinopril Swelling  . Sulfonamide Derivatives      Current Medications: Reviewed Current Outpatient Prescriptions on File Prior to Visit  Medication Sig Dispense Refill  . atenolol (TENORMIN) 25 MG tablet 2 tabs am and 1 tab pm      . atorvastatin (LIPITOR) 20 MG tablet Take 20 mg by mouth daily.      . furosemide (LASIX) 20 MG tablet Take 1 tablet (20 mg total) by mouth daily.  90 tablet  3  . gabapentin (NEURONTIN) 600 MG tablet Take 600 mg by mouth 3 (three) times daily.      . Multiple Vitamins-Minerals (THEREMS M) TABS Take by mouth daily.        .  nitroGLYCERIN (NITROSTAT) 0.4 MG SL tablet Place 0.4 mg under the tongue every 5 (five) minutes as needed.        . NON FORMULARY cpap machin qhs      . ranitidine (ZANTAC) 150 MG capsule Take 150 mg by mouth 2 (two) times daily.        . verapamil (CALAN) 80 MG tablet Take 80 mg by mouth 2 (two) times daily.      Marland Kitchen warfarin (COUMADIN) 5 MG tablet Take 5 mg by mouth daily.         No current facility-administered medications on file prior to visit.   Previous Psychotropic Medications:  Medication   None     Substance Abuse History in the last 12 months:   Caffeine:Patient denies  Tobacco:Patient denies  Alcohol:Patient denies  Illicit Drugs: Patient denies   Medical Consequences of Substance Abuse: None  Legal Consequences of Substance Abuse: N/A  Family Consequences of Substance Abuse: N/A   Social History: Reviewed Current Place of Residence: [place], [place]  Place of Birth: *[place], [place].  Family Members: Lives with her daughter.  Marital Status: Divorced  Children: 2  Sons: 1  Daughters: 1  Relationships: Patient reports her main source of emotional support.  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  Legal History: None  Hobbies/Interests: Video games   Family History: Reviewed Family History  Problem Relation Age of Onset  . Heart failure Mother   . Heart attack Brother      Mental Status Examination/Evaluation:  Objective: Appearance: Casual   Eye Contact:: Good   Speech: Clear and Coherent   Volume: Normal   Mood: "fine" 5/10  Affect: Appropriate, Congruent and Full Range   Thought Process: Coherent, Linear and Logical   Orientation: Full   Thought Content: WDL   Suicidal Thoughts: No   Homicidal Thoughts: No   Judgement: Good   Insight: Fair    Psychomotor Activity: Normal   Akathisia: No   Handed: Right   Memory: Immediate 5/5; Recent 3/5-per SLUMS exam   Assets: Communication Skills  Desire for Improvement  Housing  Social Support    Laboratory/X-Ray  Psychological Evaluation(s)   None  SLUMS: 22/30  Assessment:  AXIS I  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  Atrial fibrillation     .  CAD (coronary artery disease)       prior PCI of LAD and Lcx; [place]; Jan 2004    .  HLD (hyperlipidemia)     .  HTN (hypertension)     .  GERD (gastroesophageal reflux disease)       and esophageal ulcerations    .  Osteoarthritis     .  FHx: migraine headaches     .  Fibromyalgia     .  TIA (transient ischemic attack)     Sleep Apnea  AXIS IV  other psychosocial or environmental problems   AXIS V  61-70 mild  symptoms    Treatment Plan/Recommendations:  PLAN:  1. Again medications prescribed by this office, as patient does not want any medications.  She wishes to discontinue services, which at this point is appropriate. 2. Psychiatric medication:  A) NOne 3. Therapy: brief supportive therapy provided. Continue current services.  4. Risks and benefits, side effects and alternatives discussed with patient, she was given an opportunity to ask questions about her medication, illness, and treatment. The patient has been provided an accurate and updated list of the medications being now prescribed.  5. Patient told to call clinic if any problems occur. Patient advised to go to ER if she should develop SI/HI, side effects, or if symptoms worsen. Has crisis numbers to call if needed.  6. No labs warranted at this time.  7. The patient was encouraged to keep all PCP and specialty clinic appointments. Specifically advised patient to follow up for hypertension.  8. Patient was instructed to return to clinic if needed. We will discharge the  patient at this time.  9. The patient was advised to call and cancel their mental health appointment within 24 hours of the appointment, if they are unable to keep the appointment, as well as the three no show and termination from clinic policy.  10. The patient expressed understanding of the plan and agrees with the above.  Jacqulyn Cane, MD

## 2012-08-15 ENCOUNTER — Telehealth: Payer: Self-pay | Admitting: *Deleted

## 2012-08-15 NOTE — Telephone Encounter (Signed)
Dr. Drue Second calls DOD Dr. Daleen Squibb today b/c pt is in office experiencing heartrate of 30's. Dr. Daleen Squibb has advised pt stop Verapamil, decrease Atenolol to 25mg  twice a day and follow-up in the office Monday with PA. Mylo Red RN  I spoke with pt and she is aware of appointment for Monday with Ward Givens NP Mylo Red RN

## 2012-08-19 ENCOUNTER — Ambulatory Visit (INDEPENDENT_AMBULATORY_CARE_PROVIDER_SITE_OTHER): Payer: Medicare Other | Admitting: Nurse Practitioner

## 2012-08-19 ENCOUNTER — Encounter: Payer: Self-pay | Admitting: Nurse Practitioner

## 2012-08-19 VITALS — BP 136/78 | HR 84 | Ht 63.0 in | Wt 181.0 lb

## 2012-08-19 DIAGNOSIS — R001 Bradycardia, unspecified: Secondary | ICD-10-CM

## 2012-08-19 DIAGNOSIS — I4891 Unspecified atrial fibrillation: Secondary | ICD-10-CM

## 2012-08-19 DIAGNOSIS — I498 Other specified cardiac arrhythmias: Secondary | ICD-10-CM

## 2012-08-19 NOTE — Patient Instructions (Addendum)
Your physician has recommended that you wear an event monitor for 21 days. Event monitors are medical devices that record the heart's electrical activity. Doctors most often [place] these monitors to diagnose arrhythmias. Arrhythmias are problems with the speed or rhythm of the heartbeat. The monitor is a small, portable device. You can wear one while you do your normal daily activities. This is usually used to diagnose what is causing palpitations/syncope (passing out).  Your physician recommends that you schedule a follow-up appointment in: 1-2 weeks after the event monitor is done with Dr Jens Som

## 2012-08-19 NOTE — Progress Notes (Signed)
Patient Name: Sharon Garrison Date of Encounter: 08/19/2012  Primary Care Provider:  Dalbert Mayotte, MD Primary Cardiologist:  B. Jens Som, MD  Patient Profile  72 year old female with history of permanent atrial fibrillation who presents today for followup regarding recent documented bradycardia.  Problem List   Past Medical History  Diagnosis Date  . Atrial fibrillation     a. permanent - on chronic coumadin.  Marland Kitchen CAD (coronary artery disease)     a. prior PCI of LAD and Lcx, [place]- Jan 2004;  b. 04/2012 MV: nl perfusion, EF 69%.  Marland Kitchen HLD (hyperlipidemia)   . HTN (hypertension)   . GERD (gastroesophageal reflux disease)     and esophageal ulcerations  . Osteoarthritis   . FHx: migraine headaches   . Fibromyalgia   . TIA (transient ischemic attack)   . Gall bladder stones   . Bradycardia   . Sleep apnea     a. noncompliant with cpap.   Past Surgical History  Procedure Laterality Date  . Abdominal surgery      for small bowel obstruction  . Prior d&c    . Hysterectomy (other)    . Hernia repair      Allergies  Allergies  Allergen Reactions  . Lisinopril Swelling  . Sulfonamide Derivatives     HPI  72 year old female with the above problem list.  Approximately 10 days ago, her daughter noted her to be slumping in her recliner and unresponsive.  EMS was called and upon their arrival patient had regained responsiveness and vital signs were apparently stable.  Hospitalization was advised however patient refused.  After EMS left she had a recurrent episode which the family videotaped and patient was witnessed to be apneic for a short period.  They did not call EMS at that time and she became responsive within a few minutes.  Patient followup with her primary care provider the other day and was bradycardic with rates extending down into the 30s and 40s.  Our office was contacted and advised that patient discontinue verapamil and reduce atenolol to 25 mg b.i.d. (from 50 in  the morning and 20 5 in the evening).  Since that change late last week, patient has not had any additional unresponsive episodes.  She denies any tachycardia palpitations, chest pain, dyspnea, presyncope, or syncope.  She does have chronic lower extremity edema and spends nearly all for day with her legs in a dependent position.  She does wear support hose.  Home Medications  Prior to Admission medications   Medication Sig Start Date End Date Taking? Authorizing Provider  atenolol (TENORMIN) 25 MG tablet 1 tab am and 1 tab pm   Yes Historical Provider, MD  atorvastatin (LIPITOR) 20 MG tablet Take 20 mg by mouth daily.   Yes Historical Provider, MD  cyanocobalamin 100 MCG tablet INJECTION ONCE A MONTH   Yes Historical Provider, MD  furosemide (LASIX) 20 MG tablet Take 1 tablet (20 mg total) by mouth daily. 05/08/12  Yes Lewayne Bunting, MD  gabapentin (NEURONTIN) 600 MG tablet Take 600 mg by mouth 3 (three) times daily.   Yes Historical Provider, MD  Multiple Vitamins-Minerals (THEREMS M) TABS Take by mouth daily.     Yes Historical Provider, MD  nitroGLYCERIN (NITROSTAT) 0.4 MG SL tablet Place 0.4 mg under the tongue every 5 (five) minutes as needed.     Yes Historical Provider, MD  ranitidine (ZANTAC) 150 MG capsule Take 150 mg by mouth 2 (two) times daily.  Yes Historical Provider, MD  VESICARE 10 MG tablet Take 10 mg by mouth. 06/01/12  Yes Historical Provider, MD  warfarin (COUMADIN) 5 MG tablet Take 5 mg by mouth daily.     Yes Historical Provider, MD    Review of Systems  2 unresponsive episodes as outlined above.  She uses a walker to get around this activity is limited.  She has chronic lower extremity edema.  She otherwise denies PND, orthopnea, dizziness, syncope, chest pain, dyspnea, or early satiety.  All other systems reviewed and are otherwise negative except as noted above.  Physical Exam  Blood pressure 136/78, pulse 84, height 5\' 3"  (1.6 m), weight 181 lb (82.101 kg).    General: Pleasant, NAD Psych: Normal affect. Neuro: Alert and oriented X 3. Moves all extremities spontaneously. HEENT: Normal  Neck: Supple without bruits or JVD. Lungs:  Resp regular and unlabored, CTA. Heart: irregularly irregular no s3, s4, or murmurs. Abdomen: Soft, non-tender, non-distended, BS + x 4.  Extremities: No clubbing, cyanosis.  2+ bilateral extremity edema to the midcalf. DP/PT/Radials 2+ and equal bilaterally.  Accessory Clinical Findings  ECG - atrial fibrillation, 82, poor R wave progression, no acute ST or T changes.  Assessment & Plan  1.  Atrial fibrillation/bradycardia: Patient has permanent atrial fibrillation and has been maintained on beta blocker and calcium channel blocker therapy however due to documented bradycardia recently, a central blocker therapy was discontinued.  Her rate today is in the 80s on beta blocker alone and she is asymptomatic.  We will place a 21 day event monitor to assess for further bradycardia as well as rate control on beta blocker alone.  She remains on Coumadin therapy and this is followed by her primary care provider.  2.  Unresponsive episodes: Question bradycardia playing a role.  Monitor as above.  Patient does have a history of TIAs but refused hospitalization following these events.  3.  Chronic lower extremity edema: patient wears support hose and have encouraged her to keep her legs elevated whenever she is sitting, which she notes is just about all day.  4.  Coronary artery disease: She has not been experiencing any chest pain or dyspnea.  She remains on beta blocker and statin therapy.  5.  Disposition: Plan for 21 day event monitor and then followup with Dr. Jens Som shortly thereafter.   Nicolasa Ducking, NP 08/19/2012, 4:17 PM

## 2012-08-27 ENCOUNTER — Telehealth: Payer: Self-pay | Admitting: *Deleted

## 2012-08-27 ENCOUNTER — Encounter (INDEPENDENT_AMBULATORY_CARE_PROVIDER_SITE_OTHER): Payer: Medicare Other

## 2012-08-27 ENCOUNTER — Encounter: Payer: Self-pay | Admitting: Cardiology

## 2012-08-27 DIAGNOSIS — I498 Other specified cardiac arrhythmias: Secondary | ICD-10-CM

## 2012-08-27 NOTE — Telephone Encounter (Signed)
21 day event monitor enrolled and given to daughter Waynetta Sandy to bring to Pt. 08/27/12 TK

## 2012-09-19 ENCOUNTER — Telehealth: Payer: Self-pay | Admitting: *Deleted

## 2012-09-19 NOTE — Telephone Encounter (Signed)
Left message for pt, monitor reviewed by dr Jens Som shows atrial fib with controlled rate. Follow up scheduled 10-02-12

## 2012-10-02 ENCOUNTER — Ambulatory Visit (INDEPENDENT_AMBULATORY_CARE_PROVIDER_SITE_OTHER): Payer: Medicare Other | Admitting: Cardiology

## 2012-10-02 ENCOUNTER — Encounter: Payer: Self-pay | Admitting: Cardiology

## 2012-10-02 VITALS — BP 130/80 | HR 80 | Wt 181.0 lb

## 2012-10-02 DIAGNOSIS — I2510 Atherosclerotic heart disease of native coronary artery without angina pectoris: Secondary | ICD-10-CM

## 2012-10-02 DIAGNOSIS — I272 Pulmonary hypertension, unspecified: Secondary | ICD-10-CM

## 2012-10-02 DIAGNOSIS — E7800 Pure hypercholesterolemia, unspecified: Secondary | ICD-10-CM

## 2012-10-02 DIAGNOSIS — I10 Essential (primary) hypertension: Secondary | ICD-10-CM

## 2012-10-02 DIAGNOSIS — I2789 Other specified pulmonary heart diseases: Secondary | ICD-10-CM

## 2012-10-02 DIAGNOSIS — I4891 Unspecified atrial fibrillation: Secondary | ICD-10-CM

## 2012-10-02 NOTE — Assessment & Plan Note (Signed)
Patient remains in permanent atrial fibrillation. Continue Coumadin. She has had problems with falling but has also had prior TIAs. I discussed the risks and benefits of Coumadin today. If she has more frequent falls we may be forced to discontinue her Coumadin although she certainly would be at increased risk for recurrent CVA. Continue atenolol. Verapamil discontinued previously because of bradycardia. Recent monitor shows rate is reasonably well controlled without pauses.

## 2012-10-02 NOTE — Assessment & Plan Note (Signed)
Most likely multifactorial including sleep apnea, OHS, pulmonary venous hypertension. Continue present dose of Lasix.

## 2012-10-02 NOTE — Patient Instructions (Addendum)
Your physician recommends that you schedule a follow-up appointment in: 3 MONTHS WITH DR Jens Som IN [place]

## 2012-10-02 NOTE — Progress Notes (Signed)
HPI: Ms. Lapid is a very pleasant female with coronary artery disease, permanent atrial fibrillation, hypertension for fu. Note patient has a history of TIA, with swelling with ACE inhibitor should and previous bradycardia with beta-blockade and digoxin. Also with history of mesenteric hematoma requiring embolization. Echocardiogram repeated in [place] in 2013 and showed normal LV function, mild mitral regurgitation, moderate tricuspid regurgitation and moderate pulmonary hypertension. Myoview in December of 2013 showed an ejection fraction of 69% and normal perfusion. Patient seen in followup in March of 2014 for unresponsive episodes and bradycardia. Her calcium blocker was discontinued. Followup monitor showed atrial fibrillation with controlled ventricular response for the most part. No bradycardia. [name] Since she was last seen, she has some dyspnea on exertion. No orthopnea, PND, chest pain or syncope. She did fall losing her balance. She has chronic pedal edema.   Current Outpatient Prescriptions  Medication Sig Dispense Refill  . atenolol (TENORMIN) 25 MG tablet 1 tab am and 1 tab pm      . atorvastatin (LIPITOR) 20 MG tablet Take 20 mg by mouth daily.      . cyanocobalamin 100 MCG tablet INJECTION ONCE A MONTH      . furosemide (LASIX) 20 MG tablet Take 1 tablet (20 mg total) by mouth daily.  90 tablet  3  . gabapentin (NEURONTIN) 600 MG tablet Take 600 mg by mouth 3 (three) times daily.      Marland Kitchen lamoTRIgine (LAMICTAL) 25 MG tablet Take 3 tablets by mouth daily.      . Lutein 6 MG CAPS Take 1 capsule by mouth daily.      . Multiple Vitamins-Minerals (THEREMS M) TABS Take by mouth daily.        . nitroGLYCERIN (NITROSTAT) 0.4 MG SL tablet Place 0.4 mg under the tongue every 5 (five) minutes as needed.        . ranitidine (ZANTAC) 150 MG capsule Take 150 mg by mouth 2 (two) times daily.         . VESICARE 10 MG tablet Take 10 mg by mouth.      . warfarin (COUMADIN) 5 MG tablet Take 5 mg by mouth daily.         No current facility-administered medications for this visit.     Past Medical History  Diagnosis Date  . Atrial fibrillation     a. permanent - on chronic coumadin.  Marland Kitchen CAD (coronary artery disease)     a. prior PCI of LAD and Lcx, [place]- Jan 2004;  b. 04/2012 MV: nl perfusion, EF 69%.  Marland Kitchen HLD (hyperlipidemia)   . HTN (hypertension)   . GERD (gastroesophageal reflux disease)     and esophageal ulcerations  . Osteoarthritis   . FHx: migraine headaches   . Fibromyalgia   . TIA (transient ischemic attack)   . Gall bladder stones   . Bradycardia   . Sleep apnea     a. noncompliant with cpap.    Past Surgical History  Procedure Laterality Date  . Abdominal surgery      for small bowel obstruction  . Prior d&c    . Hysterectomy (other)    . Hernia repair      History   Social History  . Marital Status: Divorced    Spouse Name: N/A    Number of Children: N/A  . Years of Education: N/A   Occupational  History  . Not on file.   Social History Main Topics  . Smoking status: Never Smoker   . Smokeless tobacco: Not on file  . Alcohol Use: No  . Drug Use: No  . Sexually Active: No   Other Topics Concern  . Not on file   Social History Narrative  . No narrative on file    ROS: no fevers or chills, productive cough, hemoptysis, dysphasia, odynophagia, melena, hematochezia, dysuria, hematuria, rash, seizure activity, orthopnea, PND, pedal edema, claudication. Remaining systems are negative.  Physical Exam: Well-developed well-nourished in no acute distress.  Skin is warm and dry.  HEENT is normal.  Neck is supple.  Chest is clear to auscultation with normal expansion.  Cardiovascular exam is irregular Abdominal exam nontender or distended. No masses palpated. Extremities show 1+ edema. neuro grossly intact  ECG 08/19/2012-atrial  fibrillation, left ventricular hypertrophy, low voltage, nonspecific ST changes.

## 2012-10-02 NOTE — Assessment & Plan Note (Signed)
Continue statin. Not on aspirin given need for Coumadin. 

## 2012-10-02 NOTE — Assessment & Plan Note (Signed)
Blood pressure controlled. Continue present medications. 

## 2012-10-02 NOTE — Assessment & Plan Note (Signed)
Continue statin. 

## 2012-12-23 ENCOUNTER — Telehealth: Payer: Self-pay | Admitting: Cardiology

## 2012-12-23 NOTE — Telephone Encounter (Signed)
Permanent afib & H/O TIA; will need lovenox bridge. Her coumadin is managed by [facility]signed [facility] Faxed to [phone]

## 2013-01-01 ENCOUNTER — Ambulatory Visit: Payer: Self-pay | Admitting: Cardiology

## 2013-02-12 ENCOUNTER — Encounter: Payer: Self-pay | Admitting: Cardiology

## 2013-02-12 ENCOUNTER — Ambulatory Visit (INDEPENDENT_AMBULATORY_CARE_PROVIDER_SITE_OTHER): Payer: Medicare Other | Admitting: Cardiology

## 2013-02-12 VITALS — BP 134/78 | HR 75 | Ht 63.0 in | Wt 162.0 lb

## 2013-02-12 DIAGNOSIS — I2789 Other specified pulmonary heart diseases: Secondary | ICD-10-CM

## 2013-02-12 DIAGNOSIS — I2510 Atherosclerotic heart disease of native coronary artery without angina pectoris: Secondary | ICD-10-CM

## 2013-02-12 DIAGNOSIS — I4891 Unspecified atrial fibrillation: Secondary | ICD-10-CM

## 2013-02-12 DIAGNOSIS — I10 Essential (primary) hypertension: Secondary | ICD-10-CM

## 2013-02-12 DIAGNOSIS — E7800 Pure hypercholesterolemia, unspecified: Secondary | ICD-10-CM

## 2013-02-12 DIAGNOSIS — I272 Pulmonary hypertension, unspecified: Secondary | ICD-10-CM

## 2013-02-12 NOTE — Patient Instructions (Addendum)
Your physician recommends that you schedule a follow-up appointment in: 3 MONTHS WITH DR CRENSHAW  Your physician recommends that you HAVE LAB WORK TODAY  

## 2013-02-12 NOTE — Progress Notes (Signed)
HPI: [name] is a very pleasant female with coronary artery disease, permanent atrial fibrillation, hypertension for fu. Note patient has a history of TIA, swelling with ACE inhibitor and previous bradycardia with beta-blockade and digoxin. Also with history of mesenteric hematoma requiring embolization. Echocardiogram in July of 2013 showed normal LV function, moderate left atrial enlargement, mild mitral regurgitation and mild to moderate tricuspid regurgitation. Myoview in December of 2013 showed an ejection fraction of 69% and normal perfusion. Patient seen in followup in March of 2014 for unresponsive episodes and bradycardia. Her calcium blocker was discontinued. Followup monitor showed atrial fibrillation with controlled ventricular response for the most part. No bradycardia. [name] Since she was last seen in May of 2014, she has some dyspnea on exertion. No orthopnea, PND, chest pain or syncope. She has chronic pedal edema which has improved. She has had several falls and her her back. She is in rehabilitation.   Current Outpatient Prescriptions  Medication Sig Dispense Refill  . atenolol (TENORMIN) 25 MG tablet 1 tab am and 1 tab pm      . atorvastatin (LIPITOR) 20 MG tablet Take 20 mg by mouth daily.      . cyanocobalamin 100 MCG tablet INJECTION ONCE A MONTH      . donepezil (ARICEPT) 10 MG tablet TAKE ONE DAILY      . furosemide (LASIX) 20 MG tablet Take 1 tablet (20 mg total) by mouth daily.  90 tablet  3  . gabapentin (NEURONTIN) 600 MG tablet Take 600 mg by mouth 3 (three) times daily.      Marland Kitchen HYDROcodone-acetaminophen (NORCO/VICODIN) 5-325 MG per tablet TAKE ONE TABLET PRN      . lamoTRIgine (LAMICTAL) 25 MG tablet Take 3 tablets by mouth daily.      . Lutein 6 MG CAPS Take 1 capsule by mouth daily.      . nitroGLYCERIN (NITROSTAT) 0.4 MG SL tablet Place 0.4 mg under the tongue  every 5 (five) minutes as needed.        . ranitidine (ZANTAC) 150 MG capsule Take 150 mg by mouth 2 (two) times daily.        . VESICARE 10 MG tablet Take 10 mg by mouth.      . Warfarin Sodium (COUMADIN PO) Take by mouth. TAKE AS DIRECTED BY COUMADIN  CLINIC       No current facility-administered medications for this visit.     Past Medical History  Diagnosis Date  . Atrial fibrillation     a. permanent - on chronic coumadin.  Marland Kitchen CAD (coronary artery disease)     a. prior PCI of LAD and Lcx, [place]- Jan 2004;  b. 04/2012 MV: nl perfusion, EF 69%.  Marland Kitchen HLD (hyperlipidemia)   . HTN (hypertension)   . GERD (gastroesophageal reflux disease)     and esophageal ulcerations  . Osteoarthritis   . FHx: migraine headaches   . Fibromyalgia   . TIA (transient ischemic attack)   . Gall bladder stones   . Bradycardia   . Sleep apnea     a. noncompliant with cpap.    Past Surgical History  Procedure Laterality Date  . Abdominal surgery      for small bowel obstruction  . Prior d&c    . Hysterectomy (other)    . Hernia repair      History   Social History  .  Marital Status: Divorced    Spouse Name: N/A    Number of Children: N/A  . Years of Education: N/A   Occupational History  . Not on file.   Social History Main Topics  . Smoking status: Never Smoker   . Smokeless tobacco: Not on file  . Alcohol Use: No  . Drug Use: No  . Sexual Activity: No   Other Topics Concern  . Not on file   Social History Narrative  . No narrative on file    ROS: back pain but no fevers or chills, productive cough, hemoptysis, dysphasia, odynophagia, melena, hematochezia, dysuria, hematuria, rash, seizure activity, orthopnea, PND, claudication. Remaining systems are negative.  Physical Exam: Well-developed well-nourished in no acute distress.  Skin is warm and dry.  HEENT is normal.  Neck is supple.  Chest is clear to auscultation with normal expansion.  Cardiovascular exam is  irregular Abdominal exam nontender or distended. No masses palpated. Extremities show 1+ edema. neuro grossly intact  ECG atrial fibrillation at a rate of 75. Cannot rule out prior septal infarct. Nonspecific ST changes.

## 2013-02-12 NOTE — Assessment & Plan Note (Signed)
Continue statin. Not on aspirin given need for anticoagulation. 

## 2013-02-12 NOTE — Assessment & Plan Note (Signed)
Patient remains in permanent atrial fibrillation. Continue present dose of atenolol. Her rate is controlled. Issue of anti-coagulation is very difficult. She is high risk for an embolic event and has had a previous TIA. However she loses her balance frequently and falls. I have discussed the risks and benefits of anticoagulation. She understands the risk of bleeding if she falls and injures herself. We have elected to consider apixaban. I will check a hemoglobin and renal function today. He knows results we will discontinue her Coumadin and began apixaban.

## 2013-02-12 NOTE — Assessment & Plan Note (Signed)
Most likely multifactorial including sleep apnea, OHS, pulmonary venous hypertension. Continue present dose of Lasix.

## 2013-02-12 NOTE — Assessment & Plan Note (Signed)
Continue statin. 

## 2013-02-12 NOTE — Assessment & Plan Note (Signed)
Continue present blood pressure medications. 

## 2013-02-13 LAB — BASIC METABOLIC PANEL WITH GFR
BUN: 23 mg/dL (ref 6–23)
CO2: 27 mEq/L (ref 19–32)
Chloride: 103 mEq/L (ref 96–112)
GFR, Est African American: 54 mL/min — ABNORMAL LOW
Glucose, Bld: 140 mg/dL — ABNORMAL HIGH (ref 70–99)
Potassium: 3.5 mEq/L (ref 3.5–5.3)
Sodium: 139 mEq/L (ref 135–145)

## 2013-02-13 LAB — CBC
HCT: 42 % (ref 36.0–46.0)
Hemoglobin: 14 g/dL (ref 12.0–15.0)
MCH: 26.8 pg (ref 26.0–34.0)
MCHC: 33.3 g/dL (ref 30.0–36.0)
RBC: 5.22 MIL/uL — ABNORMAL HIGH (ref 3.87–5.11)

## 2013-02-17 ENCOUNTER — Other Ambulatory Visit: Payer: Self-pay

## 2013-02-17 ENCOUNTER — Other Ambulatory Visit: Payer: Self-pay | Admitting: *Deleted

## 2013-02-17 DIAGNOSIS — I4891 Unspecified atrial fibrillation: Secondary | ICD-10-CM

## 2013-02-17 MED ORDER — APIXABAN 5 MG PO TABS: 5.0000 mg | ORAL_TABLET | Freq: Two times a day (BID) | ORAL | Status: AC

## 2013-02-17 MED ORDER — APIXABAN 5 MG PO TABS: 5.0000 mg | ORAL_TABLET | Freq: Two times a day (BID) | ORAL | Status: DC

## 2013-05-14 ENCOUNTER — Encounter: Payer: Self-pay | Admitting: Cardiology

## 2013-05-14 ENCOUNTER — Ambulatory Visit (INDEPENDENT_AMBULATORY_CARE_PROVIDER_SITE_OTHER): Payer: Medicare Other | Admitting: Cardiology

## 2013-05-14 VITALS — BP 126/80 | HR 72 | Wt 162.0 lb

## 2013-05-14 DIAGNOSIS — R609 Edema, unspecified: Secondary | ICD-10-CM | POA: Insufficient documentation

## 2013-05-14 DIAGNOSIS — I4891 Unspecified atrial fibrillation: Secondary | ICD-10-CM

## 2013-05-14 DIAGNOSIS — R319 Hematuria, unspecified: Secondary | ICD-10-CM

## 2013-05-14 MED ORDER — FUROSEMIDE 40 MG PO TABS: 40.0000 mg | ORAL_TABLET | Freq: Every day | ORAL | Status: DC

## 2013-05-14 NOTE — Assessment & Plan Note (Signed)
Patient remains in permanent atrial fibrillation. Continue beta blocker. Continue apixaban. She has noticed recent mild hematuria twice. She states she had a urinalysis that was negative but I do not have those results available. I will ask urology to evaluate for possible need for further workup to

## 2013-05-14 NOTE — Assessment & Plan Note (Signed)
Continue statin. 

## 2013-05-14 NOTE — Assessment & Plan Note (Signed)
Patient was worsening pedal edema. Change Lasix to 40 mg daily. Check potassium and renal function in one week.

## 2013-05-14 NOTE — Patient Instructions (Signed)
Your physician wants you to follow-up in: 6 MONTHS WITH DR Jens Som You will receive a reminder letter in the mail two months in advance. If you don't receive a letter, please call our office to schedule the follow-up appointment.   INCREASE FUROSEMIDE TO 40 MG DAILY  Your physician recommends that you return for lab work in: ONE WEEK  REFERRAL TO [facility]= [address]= [place]= TELEPHONE # [phone]

## 2013-05-14 NOTE — Progress Notes (Signed)
HPI: Sharon Garrison is a very pleasant female with coronary artery disease, permanent atrial fibrillation, hypertension for fu. Note patient has a history of TIA, swelling with ACE inhibitor and previous bradycardia with beta-blockade and digoxin. Also with history of mesenteric hematoma requiring embolization. Echocardiogram in July of 2013 showed normal LV function, moderate left atrial enlargement, mild mitral regurgitation and mild to moderate tricuspid regurgitation. Myoview in December of 2013 showed an ejection fraction of 69% and normal perfusion. Patient seen in followup in March of 2014 for unresponsive episodes and bradycardia. Her calcium blocker was discontinued. Followup monitor showed atrial fibrillation with controlled ventricular response for the most part. No bradycardia. [name] I last saw her in September of 2014. Since then, she denies dyspnea, chest pain, palpitations or syncope. [name] Mild increased pedal edema recently.   Current Outpatient Prescriptions  Medication Sig Dispense Refill  . apixaban (ELIQUIS) 5 MG TABS tablet Take 1 tablet (5 mg total) by mouth 2 (two) times daily.  60 tablet  6  . atenolol (TENORMIN) 25 MG tablet 1 tab am and 1 tab pm      . atorvastatin (LIPITOR) 20 MG tablet Take 20 mg by mouth daily.      . cyanocobalamin 100 MCG tablet INJECTION ONCE A MONTH      . donepezil (ARICEPT) 10 MG tablet TAKE ONE DAILY      . furosemide (LASIX) 20 MG tablet Take 1 tablet (20 mg total) by mouth daily.  90 tablet  3  . gabapentin (NEURONTIN) 600 MG tablet Take 600 mg by mouth 3 (three) times daily.      Marland Kitchen HYDROcodone-acetaminophen (NORCO/VICODIN) 5-325 MG per tablet TAKE ONE TABLET PRN      . lamoTRIgine (LAMICTAL) 25 MG tablet Take 3 tablets by mouth daily.      . Lutein 6 MG CAPS Take 1 capsule by mouth daily.      .  nitroGLYCERIN (NITROSTAT) 0.4 MG SL tablet Place 0.4 mg under the tongue every 5 (five) minutes as needed.        . polyethylene glycol (MIRALAX / GLYCOLAX) packet Take 17 g by mouth daily.      . ranitidine (ZANTAC) 150 MG capsule Take 150 mg by mouth 2 (two) times daily.        . VESICARE 10 MG tablet Take 10 mg by mouth.       No current facility-administered medications for this visit.     Past Medical History  Diagnosis Date  . Atrial fibrillation     a. permanent - on chronic coumadin.  Marland Kitchen CAD (coronary artery disease)     a. prior PCI of LAD and Lcx, [place]- Jan 2004;  b. 04/2012 MV: nl perfusion, EF 69%.  Marland Kitchen HLD (hyperlipidemia)   . HTN (hypertension)   . GERD (gastroesophageal reflux disease)     and esophageal ulcerations  . Osteoarthritis   . FHx: migraine headaches   . Fibromyalgia   . TIA (transient ischemic attack)   . Gall bladder stones   . Bradycardia   . Sleep apnea     a. noncompliant with cpap.    Past Surgical History  Procedure Laterality Date  . Abdominal surgery      for small bowel obstruction  . Prior d&c    . Hysterectomy (other)    .  Hernia repair      History   Social History  . Marital Status: Divorced    Spouse Name: N/A    Number of Children: N/A  . Years of Education: N/A   Occupational History  . Not on file.   Social History Main Topics  . Smoking status: Never Smoker   . Smokeless tobacco: Not on file  . Alcohol Use: No  . Drug Use: No  . Sexual Activity: No   Other Topics Concern  . Not on file   Social History Narrative  . No narrative on file    ROS: no fevers or chills, productive cough, hemoptysis, dysphasia, odynophagia, melena, hematochezia, rash, seizure activity, orthopnea, PND, pedal edema, claudication. Remaining systems are negative.  Physical Exam: Well-developed well-nourished in no acute distress.  Skin is warm and dry.  HEENT is normal.  Neck is supple.  Chest is clear to auscultation with normal  expansion.  Cardiovascular exam is irregular Abdominal exam nontender or distended. No masses palpated. Extremities show 1+ edema. neuro grossly intact

## 2013-05-14 NOTE — Assessment & Plan Note (Signed)
Continue present blood pressure medications. 

## 2013-05-14 NOTE — Assessment & Plan Note (Signed)
Multifactorial including obesity hypoventilation syndrome and pulmonary venous hypertension.

## 2013-05-14 NOTE — Assessment & Plan Note (Signed)
Continue statin. Not on aspirin given need for anticoagulation. 

## 2013-10-14 ENCOUNTER — Telehealth: Payer: Self-pay | Admitting: Cardiology

## 2013-10-14 NOTE — Telephone Encounter (Signed)
Received request from Nurse fax box, documents faxed for surgical clearance. To: [facility] Fax number: [phone] Attention:Lori Young 5.19.15/km

## 2013-11-10 ENCOUNTER — Telehealth: Payer: Self-pay | Admitting: Cardiology

## 2013-11-10 NOTE — Telephone Encounter (Signed)
New Message  Pt called states that she is experiencing SOB currently.. Transferred to Triage//SR

## 2013-11-10 NOTE — Telephone Encounter (Signed)
Pt states that she has been SOB for about a week,inhalers seem to help for a short time. She is currently in a nursing home and an MD is supposed to see her today about this. She has talked with nurse at nursing home about her SOB this morning.   She is scheduled for knee surgery July 7 and is asking for an appt to see Dr Jens Somrenshaw prior to that surgery. [facility] but is willing to come to [place]. I do not see any availability, will forward to Victorio PalmDebra M to follow up with patient.

## 2013-11-11 NOTE — Telephone Encounter (Signed)
Spoke with pt, she cont to have some SOB. It started on Friday with exertion but by Sunday she was SOB while lying in bed. They gave her a breathing tx and it has helped. [place] was given [place] app[place] in [place] for 11-26-13. She will call if symptoms change prior to appt. She does not think she has extra fluid, no edema.

## 2013-11-26 ENCOUNTER — Encounter: Payer: Self-pay | Admitting: Cardiology

## 2013-11-26 ENCOUNTER — Encounter: Payer: Self-pay | Admitting: *Deleted

## 2013-11-26 ENCOUNTER — Ambulatory Visit (INDEPENDENT_AMBULATORY_CARE_PROVIDER_SITE_OTHER): Payer: Medicare Other | Admitting: Cardiology

## 2013-11-26 VITALS — BP 115/70 | HR 68

## 2013-11-26 DIAGNOSIS — R609 Edema, unspecified: Secondary | ICD-10-CM

## 2013-11-26 DIAGNOSIS — I4891 Unspecified atrial fibrillation: Secondary | ICD-10-CM

## 2013-11-26 DIAGNOSIS — I2510 Atherosclerotic heart disease of native coronary artery without angina pectoris: Secondary | ICD-10-CM

## 2013-11-26 DIAGNOSIS — R0600 Dyspnea, unspecified: Secondary | ICD-10-CM

## 2013-11-26 DIAGNOSIS — E7800 Pure hypercholesterolemia, unspecified: Secondary | ICD-10-CM

## 2013-11-26 DIAGNOSIS — I480 Paroxysmal atrial fibrillation: Secondary | ICD-10-CM

## 2013-11-26 DIAGNOSIS — I272 Pulmonary hypertension, unspecified: Secondary | ICD-10-CM

## 2013-11-26 DIAGNOSIS — R0989 Other specified symptoms and signs involving the circulatory and respiratory systems: Secondary | ICD-10-CM

## 2013-11-26 DIAGNOSIS — R0609 Other forms of dyspnea: Secondary | ICD-10-CM

## 2013-11-26 DIAGNOSIS — I2789 Other specified pulmonary heart diseases: Secondary | ICD-10-CM

## 2013-11-26 DIAGNOSIS — I482 Chronic atrial fibrillation, unspecified: Secondary | ICD-10-CM

## 2013-11-26 DIAGNOSIS — I10 Essential (primary) hypertension: Secondary | ICD-10-CM

## 2013-11-26 NOTE — Patient Instructions (Signed)
Your physician recommends that you schedule a follow-up appointment in: 4 WEEKS WITH DR Jens SomRENSHAW  Your physician has requested that you have an echocardiogram. Echocardiography is a painless test that uses sound waves to create images of your heart. It provides your doctor with information about the size and shape of your heart and how well your heart's chambers and valves are working. This procedure takes approximately one hour. There are no restrictions for this procedure.

## 2013-11-26 NOTE — Assessment & Plan Note (Signed)
Continue present dose of Lasix for now. Her edema and weight have actually decreased since last evaluation.

## 2013-11-26 NOTE — Assessment & Plan Note (Signed)
Continue statin. 

## 2013-11-26 NOTE — Assessment & Plan Note (Signed)
Continue statin. Not on aspirin given need for anticoagulation. 

## 2013-11-26 NOTE — Assessment & Plan Note (Addendum)
Pulmonary hypertension/dyspnea-Most likely multifactorial including obesity hypoventilation syndrome, OSA, deconditioning and pulmonary venous hypertension. There apparently was restriction noted on recent pulmonary function tests. We will obtain all records from her recent pulmonary evaluation. Check BNP. Increased diuretics if elevated. She is not markedly volume overloaded on examination.

## 2013-11-26 NOTE — Assessment & Plan Note (Signed)
Blood pressure controlled. Continue present medications. 

## 2013-11-26 NOTE — Progress Notes (Signed)
HPI: FU coronary artery disease, permanent atrial fibrillation, hypertension. Note patient has a history of TIA, swelling with ACE inhibitor and previous bradycardia with beta-blockade and digoxin. Also with history of mesenteric hematoma requiring embolization. Echocardiogram in July of 2013 showed normal LV function, moderate left atrial enlargement, mild mitral regurgitation and mild to moderate tricuspid regurgitation. Myoview in December of 2013 showed an ejection fraction of 69% and normal perfusion. Patient seen in followup in March of 2014 for unresponsive episodes and bradycardia. Her calcium blocker was discontinued. Followup monitor showed atrial fibrillation with controlled ventricular response for the most part. No bradycardia. [name] I last saw her in December of 2014. Since then, Over the past month she has noticed increasing dyspnea. This occurs with exertion. She has had weight loss. She denies increased pedal edema, chest pain, palpitations or syncope. [facility]. She was told she had 40% lung capacity.   Current Outpatient Prescriptions  Medication Sig Dispense Refill  . albuterol (PROVENTIL HFA;VENTOLIN HFA) 108 (90 BASE) MCG/ACT inhaler Inhale into the lungs every 6 (six) hours as needed for wheezing or shortness of breath.      Marland Kitchen. apixaban (ELIQUIS) 5 MG TABS tablet Take 1 tablet (5 mg total) by mouth 2 (two) times daily.  60 tablet  6  . atenolol (TENORMIN) 25 MG tablet 1 tab am and 1 tab pm      . atorvastatin (LIPITOR) 20 MG tablet Take 20 mg by mouth daily.      . cyanocobalamin 100 MCG tablet INJECTION ONCE A MONTH      . donepezil (ARICEPT) 10 MG tablet TAKE ONE DAILY      . furosemide (LASIX) 20 MG tablet 3 tabs po qd      . gabapentin (NEURONTIN) 600 MG tablet Take 600 mg by mouth 3 (three) times daily.      Marland Kitchen.  HYDROcodone-acetaminophen (NORCO/VICODIN) 5-325 MG per tablet TAKE ONE TABLET PRN      . lamoTRIgine (LAMICTAL) 100 MG tablet Take 100 mg by mouth 2 (two) times daily.      . Lutein 6 MG CAPS Take 1 capsule by mouth daily.      . nitroGLYCERIN (NITROSTAT) 0.4 MG SL tablet Place 0.4 mg under the tongue every 5 (five) minutes as needed.        . polyethylene glycol (MIRALAX / GLYCOLAX) packet Take 17 g by mouth daily.      . ranitidine (ZANTAC) 150 MG capsule Take 150 mg by mouth 2 (two) times daily.        . VESICARE 10 MG tablet Take 10 mg by mouth.       No current facility-administered medications for this visit.     Past Medical History  Diagnosis Date  . Atrial fibrillation     a. permanent - on chronic coumadin.  Marland Kitchen. CAD (coronary artery disease)     a. prior PCI of LAD and Lcx, [place]- Jan 2004;  b. 04/2012 MV: nl perfusion, EF 69%.  Marland Kitchen. HLD (hyperlipidemia)   . HTN (hypertension)   . GERD (gastroesophageal reflux disease)     and esophageal ulcerations  . Osteoarthritis   . FHx: migraine headaches   . Fibromyalgia   . TIA (transient ischemic attack)   . Gall bladder stones   . Bradycardia   . Sleep  apnea     a. noncompliant with cpap.    Past Surgical History  Procedure Laterality Date  . Abdominal surgery      for small bowel obstruction  . Prior d&c    . Hysterectomy (other)    . Hernia repair      History   Social History  . Marital Status: Divorced    Spouse Name: N/A    Number of Children: N/A  . Years of Education: N/A   Occupational History  . Not on file.   Social History Main Topics  . Smoking status: Never Smoker   . Smokeless tobacco: Not on file  . Alcohol Use: No  . Drug Use: No  . Sexual Activity: No   Other Topics Concern  . Not on file   Social History Narrative  . No narrative on file    ROS: no fevers or chills, productive cough, hemoptysis, dysphasia, odynophagia, melena, hematochezia, dysuria, hematuria, rash, seizure  activity, orthopnea, PND, pedal edema, claudication. Remaining systems are negative.  Physical Exam: Well-developed obese in no acute distress.  Skin is warm and dry.  HEENT is normal.  Neck is supple.  Chest is clear to auscultation with normal expansion.  Cardiovascular exam is irregular Abdominal exam nontender or distended. No masses palpated. Extremities show 1+ edema. neuro grossly intact  ECG Atrial fibrillation with low voltage. Normal axis. No significant ST changes.

## 2013-11-26 NOTE — Assessment & Plan Note (Signed)
Continue beta blocker and apixaban. Check potassium, renal function and hemoglobin.

## 2013-11-27 LAB — BASIC METABOLIC PANEL WITH GFR
BUN: 23 mg/dL (ref 6–23)
CO2: 34 meq/L — AB (ref 19–32)
CREATININE: 1.25 mg/dL — AB (ref 0.50–1.10)
Calcium: 8.9 mg/dL (ref 8.4–10.5)
Chloride: 100 mEq/L (ref 96–112)
GFR, EST NON AFRICAN AMERICAN: 42 mL/min — AB
GFR, Est African American: 49 mL/min — ABNORMAL LOW
Glucose, Bld: 96 mg/dL (ref 70–99)
POTASSIUM: 4.7 meq/L (ref 3.5–5.3)
Sodium: 142 mEq/L (ref 135–145)

## 2013-11-27 LAB — BRAIN NATRIURETIC PEPTIDE: Brain Natriuretic Peptide: 209.2 pg/mL — ABNORMAL HIGH (ref 0.0–100.0)

## 2013-11-27 LAB — CBC
HEMATOCRIT: 39.4 % (ref 36.0–46.0)
Hemoglobin: 12.7 g/dL (ref 12.0–15.0)
MCH: 27.9 pg (ref 26.0–34.0)
MCHC: 32.2 g/dL (ref 30.0–36.0)
MCV: 86.4 fL (ref 78.0–100.0)
Platelets: 192 10*3/uL (ref 150–400)
RBC: 4.56 MIL/uL (ref 3.87–5.11)
RDW: 16.3 % — AB (ref 11.5–15.5)
WBC: 12.5 10*3/uL — AB (ref 4.0–10.5)

## 2013-12-01 ENCOUNTER — Telehealth: Payer: Self-pay | Admitting: Cardiology

## 2013-12-01 NOTE — Telephone Encounter (Signed)
Wants test results from last week . Wants to talk to Deliah GoodyDebra Mathis if possible.  Please call

## 2013-12-01 NOTE — Telephone Encounter (Signed)
Message forwarded to Debra

## 2013-12-02 NOTE — Telephone Encounter (Signed)
Spoke with pt, dr Jens Somcrenshaw has reviewed the records from [facility] and pt aware he does not feel her SOB is related to her heart. We will await the results from her echo. Pt agreed with this plan.

## 2013-12-15 ENCOUNTER — Ambulatory Visit (HOSPITAL_COMMUNITY): Payer: Medicare Other | Attending: Cardiovascular Disease | Admitting: Cardiology

## 2013-12-15 DIAGNOSIS — E785 Hyperlipidemia, unspecified: Secondary | ICD-10-CM | POA: Insufficient documentation

## 2013-12-15 DIAGNOSIS — E669 Obesity, unspecified: Secondary | ICD-10-CM | POA: Insufficient documentation

## 2013-12-15 DIAGNOSIS — I10 Essential (primary) hypertension: Secondary | ICD-10-CM | POA: Diagnosis not present

## 2013-12-15 DIAGNOSIS — I079 Rheumatic tricuspid valve disease, unspecified: Secondary | ICD-10-CM | POA: Insufficient documentation

## 2013-12-15 DIAGNOSIS — I4891 Unspecified atrial fibrillation: Secondary | ICD-10-CM | POA: Insufficient documentation

## 2013-12-15 DIAGNOSIS — I480 Paroxysmal atrial fibrillation: Secondary | ICD-10-CM

## 2013-12-15 DIAGNOSIS — R0989 Other specified symptoms and signs involving the circulatory and respiratory systems: Secondary | ICD-10-CM | POA: Insufficient documentation

## 2013-12-15 DIAGNOSIS — I517 Cardiomegaly: Secondary | ICD-10-CM | POA: Diagnosis not present

## 2013-12-15 DIAGNOSIS — R0609 Other forms of dyspnea: Secondary | ICD-10-CM | POA: Diagnosis not present

## 2013-12-15 DIAGNOSIS — R0600 Dyspnea, unspecified: Secondary | ICD-10-CM

## 2013-12-15 DIAGNOSIS — I2510 Atherosclerotic heart disease of native coronary artery without angina pectoris: Secondary | ICD-10-CM | POA: Diagnosis not present

## 2013-12-15 NOTE — Progress Notes (Signed)
Echo performed. 

## 2013-12-17 ENCOUNTER — Ambulatory Visit (INDEPENDENT_AMBULATORY_CARE_PROVIDER_SITE_OTHER): Payer: Medicare Other | Admitting: Cardiology

## 2013-12-17 ENCOUNTER — Encounter: Payer: Self-pay | Admitting: Cardiology

## 2013-12-17 VITALS — BP 100/60 | HR 64 | Ht 63.0 in | Wt 164.1 lb

## 2013-12-17 DIAGNOSIS — I2789 Other specified pulmonary heart diseases: Secondary | ICD-10-CM

## 2013-12-17 DIAGNOSIS — I209 Angina pectoris, unspecified: Secondary | ICD-10-CM

## 2013-12-17 DIAGNOSIS — I2510 Atherosclerotic heart disease of native coronary artery without angina pectoris: Secondary | ICD-10-CM

## 2013-12-17 DIAGNOSIS — E7800 Pure hypercholesterolemia, unspecified: Secondary | ICD-10-CM

## 2013-12-17 DIAGNOSIS — I10 Essential (primary) hypertension: Secondary | ICD-10-CM

## 2013-12-17 DIAGNOSIS — I25119 Atherosclerotic heart disease of native coronary artery with unspecified angina pectoris: Secondary | ICD-10-CM

## 2013-12-17 DIAGNOSIS — I482 Chronic atrial fibrillation, unspecified: Secondary | ICD-10-CM

## 2013-12-17 DIAGNOSIS — R609 Edema, unspecified: Secondary | ICD-10-CM

## 2013-12-17 DIAGNOSIS — I4891 Unspecified atrial fibrillation: Secondary | ICD-10-CM

## 2013-12-17 DIAGNOSIS — I272 Pulmonary hypertension, unspecified: Secondary | ICD-10-CM

## 2013-12-17 NOTE — Assessment & Plan Note (Signed)
Continue statin. Not on aspirin given need for anticoagulations. 

## 2013-12-17 NOTE — Patient Instructions (Signed)
Your physician recommends that you schedule a follow-up appointment in: 3 MONTHS WITH DR CRENSHAW  

## 2013-12-17 NOTE — Assessment & Plan Note (Signed)
Blood pressure controlled. Continue present medications. 

## 2013-12-17 NOTE — Progress Notes (Signed)
HPI: FU coronary artery disease (previous PCI), permanent atrial fibrillation, hypertension. Note patient has a history of TIA, swelling with ACE inhibitor and previous bradycardia with beta-blockade and digoxin. Also with history of mesenteric hematoma requiring embolization. Myoview in December of 2013 showed an ejection fraction of 69% and normal perfusion. Patient seen in followup in March of 2014 for unresponsive episodes and bradycardia. Her calcium blocker was discontinued. Followup monitor showed atrial fibrillation with controlled ventricular response for the most part. No bradycardia. [name] I saw her recently and she was complaining of increased dyspnea. Echocardiogram repeated in July of 2015 and showed normal LV function, biatrial enlargement, mild to moderate tricuspid regurgitation and mildly elevated pulmonary pressures. Chest x-ray July 2015 showed stable cardiac enlargement and scarring. Pulmonary function tests in June of 2015 showed severe restriction and mild obstruction. BNP in July of 2015 209. Normal Hgb and K. Patient seen by pulmonary and obstruction felt possibly secondary to kyphosis. There was also a degree of small airway impairment and obstructive sleep apnea. Since I last saw her,    Current Outpatient Prescriptions  Medication Sig Dispense Refill  . albuterol (PROVENTIL HFA;VENTOLIN HFA) 108 (90 BASE) MCG/ACT inhaler Inhale into the lungs every 6 (six) hours as needed for wheezing or shortness of breath.      Marland Kitchen apixaban (ELIQUIS) 5 MG TABS tablet Take 1 tablet (5 mg total) by mouth 2 (two) times daily.  60 tablet  6  . atenolol (TENORMIN) 25 MG tablet 1 tab am and 1 tab pm      . atorvastatin (LIPITOR) 20 MG tablet Take 20 mg by mouth daily.      . cyanocobalamin 100 MCG tablet INJECTION ONCE A MONTH      . donepezil (ARICEPT) 10 MG tablet TAKE ONE DAILY      .  furosemide (LASIX) 20 MG tablet 3 tabs po qd      . gabapentin (NEURONTIN) 600 MG tablet Take 600 mg by mouth 3 (three) times daily.      Marland Kitchen HYDROcodone-acetaminophen (NORCO/VICODIN) 5-325 MG per tablet TAKE ONE TABLET PRN      . lamoTRIgine (LAMICTAL) 100 MG tablet Take 100 mg by mouth 2 (two) times daily.      . Lutein 6 MG CAPS Take 1 capsule by mouth daily.      . nitroGLYCERIN (NITROSTAT) 0.4 MG SL tablet Place 0.4 mg under the tongue every 5 (five) minutes as needed.        . polyethylene glycol (MIRALAX / GLYCOLAX) packet Take 17 g by mouth daily.      . ranitidine (ZANTAC) 150 MG capsule Take 150 mg by mouth 2 (two) times daily.        . VESICARE 10 MG tablet Take 10 mg by mouth.       No current facility-administered medications for this visit.     Past Medical History  Diagnosis Date  . Atrial fibrillation     a. permanent - on chronic coumadin.  Marland Kitchen CAD (coronary artery disease)     a. prior PCI of LAD and Lcx, [place]- Jan 2004;  b. 04/2012 MV: nl perfusion, EF 69%.  Marland Kitchen HLD (hyperlipidemia)   . HTN (hypertension)   . GERD (gastroesophageal reflux disease)     and esophageal ulcerations  . Osteoarthritis   . FHx: migraine headaches   . Fibromyalgia   .  TIA (transient ischemic attack)   . Gall bladder stones   . Bradycardia   . Sleep apnea     a. noncompliant with cpap.    Past Surgical History  Procedure Laterality Date  . Abdominal surgery      for small bowel obstruction  . Prior d&c    . Hysterectomy (other)    . Hernia repair      History   Social History  . Marital Status: Divorced    Spouse Name: N/A    Number of Children: N/A  . Years of Education: N/A   Occupational History  . Not on file.   Social History Main Topics  . Smoking status: Never Smoker   . Smokeless tobacco: Not on file  . Alcohol Use: No  . Drug Use: No  . Sexual Activity: No   Other Topics Concern  . Not on file   Social History Narrative  . No narrative on file     ROS: no fevers or chills, productive cough, hemoptysis, dysphasia, odynophagia, melena, hematochezia, dysuria, hematuria, rash, seizure activity, orthopnea, PND, pedal edema, claudication. Remaining systems are negative.  Physical Exam: Well-developed well-nourished in no acute distress.  Skin is warm and dry.  HEENT is normal.  Neck is supple.  Chest is clear to auscultation with normal expansion.  Cardiovascular exam is irregular Abdominal exam nontender or distended. No masses palpated. Extremities show 1+ ankle edema. neuro grossly intact

## 2013-12-17 NOTE — Assessment & Plan Note (Signed)
Continue present dose of Lasix. 

## 2013-12-17 NOTE — Assessment & Plan Note (Signed)
Continue beta blocker and apixaban. 

## 2013-12-17 NOTE — Assessment & Plan Note (Signed)
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 She is not markedly volume overloaded on examination. Continue present dose of Lasix. Followup pulmonary for initiation of CPAP. Continue home oxygen.

## 2013-12-17 NOTE — Assessment & Plan Note (Signed)
Continue statin. 

## 2014-03-04 ENCOUNTER — Encounter: Payer: Self-pay | Admitting: Cardiology

## 2014-03-04 NOTE — Progress Notes (Signed)
HPI: FU coronary artery disease (previous PCI), permanent atrial fibrillation, hypertension. Note patient has a history of TIA, swelling with ACE inhibitor and previous bradycardia with beta-blockade and digoxin. Also with history of mesenteric hematoma requiring embolization. Myoview in December of 2013 showed an ejection fraction of 69% and normal perfusion. Patient seen in followup in March of 2014 for unresponsive episodes and bradycardia. Her calcium blocker was discontinued. Followup monitor showed atrial fibrillation with controlled ventricular response for the most part. No bradycardia. [name] Echocardiogram repeated in July of 2015 and showed normal LV function, biatrial enlargement, mild to moderate tricuspid regurgitation and mildly elevated pulmonary pressures. Chest x-ray July 2015 showed stable cardiac enlargement and scarring. Pulmonary function tests in June of 2015 showed severe restriction and mild obstruction. BNP in July of 2015 209. Normal Hgb and K. Patient seen by pulmonary and obstruction felt possibly secondary to kyphosis. There was also a degree of small airway impairment and obstructive sleep apnea. Since I last saw her,    Current Outpatient Prescriptions  Medication Sig Dispense Refill  . albuterol (PROVENTIL HFA;VENTOLIN HFA) 108 (90 BASE) MCG/ACT inhaler Inhale into the lungs every 6 (six) hours as needed for wheezing or shortness of breath.      Marland Kitchen apixaban (ELIQUIS) 5 MG TABS tablet Take 1 tablet (5 mg total) by mouth 2 (two) times daily.  60 tablet  6  . atenolol (TENORMIN) 25 MG tablet 1 tab am and 1 tab pm      . atorvastatin (LIPITOR) 20 MG tablet Take 20 mg by mouth daily.      . cyanocobalamin 100 MCG tablet INJECTION ONCE A MONTH      . donepezil (ARICEPT) 10 MG tablet TAKE ONE DAILY      . furosemide (LASIX) 20 MG tablet 3 tabs po qd      . gabapentin  (NEURONTIN) 600 MG tablet Take 600 mg by mouth 3 (three) times daily.      Marland Kitchen HYDROcodone-acetaminophen (NORCO/VICODIN) 5-325 MG per tablet TAKE ONE TABLET PRN      . lamoTRIgine (LAMICTAL) 100 MG tablet Take 100 mg by mouth 2 (two) times daily.      . Lutein 6 MG CAPS Take 1 capsule by mouth daily.      . nitroGLYCERIN (NITROSTAT) 0.4 MG SL tablet Place 0.4 mg under the tongue every 5 (five) minutes as needed.        . polyethylene glycol (MIRALAX / GLYCOLAX) packet Take 17 g by mouth daily.      . ranitidine (ZANTAC) 150 MG capsule Take 150 mg by mouth 2 (two) times daily.        . VESICARE 10 MG tablet Take 10 mg by mouth.       No current facility-administered medications for this visit.     Past Medical History  Diagnosis Date  . Atrial fibrillation     a. permanent - on chronic coumadin.  Marland Kitchen CAD (coronary artery disease)     a. prior PCI of LAD and Lcx, [place]- Jan 2004;  b. 04/2012 MV: nl perfusion, EF 69%.  Marland Kitchen HLD (hyperlipidemia)   . HTN (hypertension)   . GERD (gastroesophageal reflux disease)     and esophageal ulcerations  . Osteoarthritis   . FHx: migraine headaches   . Fibromyalgia   . TIA (transient ischemic attack)   . Gall  bladder stones   . Bradycardia   . Sleep apnea     a. noncompliant with cpap.    Past Surgical History  Procedure Laterality Date  . Abdominal surgery      for small bowel obstruction  . Prior d&c    . Hysterectomy (other)    . Hernia repair      History   Social History  . Marital Status: Divorced    Spouse Name: N/A    Number of Children: N/A  . Years of Education: N/A   Occupational History  . Not on file.   Social History Main Topics  . Smoking status: Never Smoker   . Smokeless tobacco: Not on file  . Alcohol Use: No  . Drug Use: No  . Sexual Activity: No   Other Topics Concern  . Not on file   Social History Narrative  . No narrative on file    ROS: no fevers or chills, productive cough, hemoptysis, dysphasia,  odynophagia, melena, hematochezia, dysuria, hematuria, rash, seizure activity, orthopnea, PND, pedal edema, claudication. Remaining systems are negative.  Physical Exam: Well-developed well-nourished in no acute distress.  Skin is warm and dry.  HEENT is normal.  Neck is supple.  Chest is clear to auscultation with normal expansion.  Cardiovascular exam is regular rate and rhythm.  Abdominal exam nontender or distended. No masses palpated. Extremities show no edema. neuro grossly intact  ECG      This encounter was created in error - please disregard.

## 2014-03-09 ENCOUNTER — Encounter: Payer: Self-pay | Admitting: Cardiology

## 2014-07-28 DEATH — deceased
